# Patient Record
Sex: Female | Born: 1970 | Race: Black or African American | Hispanic: No | Marital: Single | Smoking: Current every day smoker
Health system: Southern US, Community
[De-identification: ages and names within clinical notes are randomized; demographics above are authoritative.]

## PROBLEM LIST (undated history)

## (undated) DIAGNOSIS — I739 Peripheral vascular disease, unspecified: Secondary | ICD-10-CM

## (undated) DIAGNOSIS — G8929 Other chronic pain: Secondary | ICD-10-CM

## (undated) DIAGNOSIS — L301 Dyshidrosis [pompholyx]: Secondary | ICD-10-CM

## (undated) DIAGNOSIS — I700 Atherosclerosis of aorta: Secondary | ICD-10-CM

## (undated) DIAGNOSIS — R200 Anesthesia of skin: Secondary | ICD-10-CM

## (undated) DIAGNOSIS — R590 Localized enlarged lymph nodes: Secondary | ICD-10-CM

## (undated) DIAGNOSIS — I701 Atherosclerosis of renal artery: Secondary | ICD-10-CM

## (undated) DIAGNOSIS — F419 Anxiety disorder, unspecified: Secondary | ICD-10-CM

## (undated) DIAGNOSIS — M549 Dorsalgia, unspecified: Secondary | ICD-10-CM

## (undated) DIAGNOSIS — Z87442 Personal history of urinary calculi: Secondary | ICD-10-CM

## (undated) DIAGNOSIS — I2510 Atherosclerotic heart disease of native coronary artery without angina pectoris: Secondary | ICD-10-CM

## (undated) DIAGNOSIS — I10 Essential (primary) hypertension: Secondary | ICD-10-CM

## (undated) DIAGNOSIS — F17200 Nicotine dependence, unspecified, uncomplicated: Secondary | ICD-10-CM

## (undated) HISTORY — DX: Dyshidrosis (pompholyx): L30.1

## (undated) HISTORY — DX: Nicotine dependence, unspecified, uncomplicated: F17.200

## (undated) HISTORY — DX: Dorsalgia, unspecified: M54.9

## (undated) HISTORY — DX: Peripheral vascular disease, unspecified: I73.9

## (undated) HISTORY — DX: Personal history of urinary calculi: Z87.442

## (undated) HISTORY — DX: Essential (primary) hypertension: I10

## (undated) HISTORY — DX: Atherosclerosis of aorta: I70.0

## (undated) HISTORY — DX: Anesthesia of skin: R20.0

## (undated) HISTORY — DX: Other chronic pain: G89.29

---

## 2001-09-17 ENCOUNTER — Encounter: Admission: RE | Admit: 2001-09-17 | Discharge: 2001-09-17 | Payer: Self-pay

## 2005-12-26 ENCOUNTER — Emergency Department (HOSPITAL_COMMUNITY): Admission: EM | Admit: 2005-12-26 | Discharge: 2005-12-26 | Payer: Self-pay | Admitting: Emergency Medicine

## 2009-06-11 DIAGNOSIS — I219 Acute myocardial infarction, unspecified: Secondary | ICD-10-CM

## 2009-06-11 HISTORY — DX: Acute myocardial infarction, unspecified: I21.9

## 2009-06-11 HISTORY — PX: CORONARY STENT PLACEMENT: SHX1402

## 2009-12-09 ENCOUNTER — Emergency Department (HOSPITAL_COMMUNITY)
Admission: EM | Admit: 2009-12-09 | Discharge: 2009-12-09 | Payer: Self-pay | Source: Home / Self Care | Admitting: Emergency Medicine

## 2010-02-19 ENCOUNTER — Emergency Department (HOSPITAL_COMMUNITY): Admission: EM | Admit: 2010-02-19 | Discharge: 2010-02-19 | Payer: Self-pay | Admitting: Emergency Medicine

## 2010-07-12 HISTORY — PX: OTHER SURGICAL HISTORY: SHX169

## 2010-12-08 ENCOUNTER — Telehealth: Payer: Self-pay | Admitting: Cardiovascular Disease

## 2010-12-08 NOTE — Telephone Encounter (Signed)
Patient was referred to our office for follow up of stent placement.  Stent was placed the [facility].  Dr. Clovis Riley office is waiting for records from [facility] before sending to [place].  They will fax as time gets closer.  I scheduled patient for 12/29/2010.

## 2010-12-11 ENCOUNTER — Encounter: Payer: Self-pay | Admitting: Cardiovascular Disease

## 2010-12-29 ENCOUNTER — Ambulatory Visit (INDEPENDENT_AMBULATORY_CARE_PROVIDER_SITE_OTHER): Payer: Medicaid Other | Admitting: Cardiovascular Disease

## 2010-12-29 ENCOUNTER — Encounter: Payer: Self-pay | Admitting: Cardiovascular Disease

## 2010-12-29 DIAGNOSIS — Z01818 Encounter for other preprocedural examination: Secondary | ICD-10-CM

## 2010-12-29 DIAGNOSIS — E785 Hyperlipidemia, unspecified: Secondary | ICD-10-CM

## 2010-12-29 DIAGNOSIS — I701 Atherosclerosis of renal artery: Secondary | ICD-10-CM | POA: Insufficient documentation

## 2010-12-29 DIAGNOSIS — I10 Essential (primary) hypertension: Secondary | ICD-10-CM | POA: Insufficient documentation

## 2010-12-29 DIAGNOSIS — I2510 Atherosclerotic heart disease of native coronary artery without angina pectoris: Secondary | ICD-10-CM | POA: Insufficient documentation

## 2010-12-29 HISTORY — DX: Atherosclerosis of renal artery: I70.1

## 2010-12-29 HISTORY — DX: Essential (primary) hypertension: I10

## 2010-12-29 HISTORY — DX: Hyperlipidemia, unspecified: E78.5

## 2010-12-29 LAB — CBC WITH DIFFERENTIAL/PLATELET
Basophils Absolute: 0 10*3/uL (ref 0.0–0.1)
Basophils Relative: 0.2 % (ref 0.0–3.0)
HCT: 41.3 % (ref 36.0–46.0)
MCHC: 33.1 g/dL (ref 30.0–36.0)
Monocytes Relative: 4.1 % (ref 3.0–12.0)
Neutro Abs: 8.2 10*3/uL — ABNORMAL HIGH (ref 1.4–7.7)
Neutrophils Relative %: 76.7 % (ref 43.0–77.0)
Platelets: 246 10*3/uL (ref 150.0–400.0)
RBC: 4.1 Mil/uL (ref 3.87–5.11)
RDW: 14.1 % (ref 11.5–14.6)
WBC: 10.7 10*3/uL — ABNORMAL HIGH (ref 4.5–10.5)

## 2010-12-29 LAB — PROTIME-INR: Prothrombin Time: 10.5 s (ref 10.2–12.4)

## 2010-12-29 LAB — BASIC METABOLIC PANEL
Glucose, Bld: 110 mg/dL — ABNORMAL HIGH (ref 70–99)
Potassium: 4.7 mEq/L (ref 3.5–5.1)

## 2010-12-29 NOTE — Assessment & Plan Note (Addendum)
Pt presents with exertional chest pain and dyspnea.  This certainly could be due to coronary stent restenosis.  I have recommended that we do a repeat cath.  We have discussed the risks, benefits, and options about cardiac cath.  She understands and agrees to proceed.  We will schedule her for a left heart cath with possible PCI.  I have asked Dr. Sanjuana Kava to do the cath since I will not be available to do it.  We will also do a distal aortogram if the creatinine is not too elevated.  If the cath is clean, the symptoms are likely due to uncontrolled HTN.   She needs a much more aggressive approach to her HTN.  We may be able to use and ACE-inhibitor but I would like to see what her renal function is.  I strongly advised that she stop smoking today. She has significant atherosclerotic disease and her continued cigarette smoking was a poor risk for further vascular problems.

## 2010-12-29 NOTE — Progress Notes (Signed)
Laura Garrett Date of Birth  Mar 31, 1971 [facility] / [facility] [address]     [address] [place], [place]  [address] [phone]  Fax  [phone]  History of Present Illness:  40 yo with hx of CAD - s/p stent in [place],  Hx of HTN and hyperlipidemia.  Also history of renal artery stent. She does not have any information about her stents. She does have a stent card but she did not bring it with her.   Complains of constant HA.  Still having exertional chest pressure and dyspnea.  Walks daily - has cp when she walks up hill or when she is mowing her yard.  She denies any syncope or presyncope. She denies any PND or orthopnea.  Current Outpatient Prescriptions on File Prior to Visit  Medication Sig Dispense Refill  . amLODipine (NORVASC) 10 MG tablet Take 10 mg by mouth daily.        . clopidogrel (PLAVIX) 75 MG tablet Take 75 mg by mouth daily.        . nitroGLYCERIN (NITROSTAT) 0.4 MG SL tablet Place 0.4 mg under the tongue every 5 (five) minutes as needed.        . simvastatin (ZOCOR) 40 MG tablet Take 40 mg by mouth at bedtime.        Marland Kitchen CITALOPRAM HYDROBROMIDE PO Take 10 mg by mouth daily.          No Known Allergies  Past Medical History  Diagnosis Date  . Hypertension   . MI (myocardial infarction)   . Chronic back pain   . Leg numbness   . Numbness of foot   . Leg weakness     Past Surgical History  Procedure Date  . Coronary stent placement 2011  . Kidney stent placement 07/2010    History  Smoking status  . Current Everyday Smoker -- 0.5 packs/day  . Types: Cigarettes  Smokeless tobacco  . Not on file    History  Alcohol Use No    Family History  Problem Relation Age of Onset  . Hypertension Mother   . Hypertension Brother   . Hypertension Paternal Grandmother   . Hyperlipidemia Paternal Grandmother   . Stroke Paternal Grandmother   . Kidney disease Paternal Grandmother     Reviw of Systems:  Reviewed in  the HPI.  All other systems are negative.  Physical Exam: BP 160/100  Pulse 60  Ht 5\' 6"  (1.676 m)  Wt 135 lb 6.4 oz (61.417 kg)  BMI 21.85 kg/m2 The patient is alert and oriented x 3.  The mood and affect are normal.   Skin: warm and dry.  Color is normal.    HEENT:   the sclera are nonicteric.  The mucous membranes are moist.  The carotids are 2+ without bruits.  There is no thyromegaly.  There is no JVD.    Lungs: clear.  The chest wall is non tender.    Heart: regular rate with a normal S1 and S2.  There are no murmurs, gallops, or rubs. The PMI is not displaced.     Abdomin: good bowel sounds.  There is no guarding or rebound.  There is no hepatosplenomegaly or tenderness.  There are no masses.   Extremities:  no clubbing, cyanosis, or edema.  The legs are without rashes.  The distal pulses are intact.   Neuro:  Cranial nerves II - XII are intact.  Motor and sensory functions are intact.  The gait is normal.  ECG: Sinus bradycardia.  Voltage for LVH.  Assessment / Plan:

## 2011-01-01 ENCOUNTER — Encounter: Payer: Self-pay | Admitting: Cardiovascular Disease

## 2011-01-02 ENCOUNTER — Telehealth: Payer: Self-pay | Admitting: Cardiovascular Disease

## 2011-01-02 ENCOUNTER — Inpatient Hospital Stay (HOSPITAL_BASED_OUTPATIENT_CLINIC_OR_DEPARTMENT_OTHER)
Admission: RE | Admit: 2011-01-02 | Discharge: 2011-01-02 | Disposition: A | Discharge status: Home / Self Care | Payer: Medicaid Other | Source: Ambulatory Visit | Attending: Cardiovascular Disease | Admitting: Cardiovascular Disease

## 2011-01-02 DIAGNOSIS — I2510 Atherosclerotic heart disease of native coronary artery without angina pectoris: Secondary | ICD-10-CM | POA: Insufficient documentation

## 2011-01-02 DIAGNOSIS — R079 Chest pain, unspecified: Secondary | ICD-10-CM

## 2011-01-02 DIAGNOSIS — Z9861 Coronary angioplasty status: Secondary | ICD-10-CM | POA: Insufficient documentation

## 2011-01-02 DIAGNOSIS — I739 Peripheral vascular disease, unspecified: Secondary | ICD-10-CM

## 2011-01-02 NOTE — Telephone Encounter (Signed)
Mark from [facility] cath lab called and wanted to set up follow cath appointment 2-3 wks fm now.

## 2011-01-03 NOTE — Cardiovascular Report (Signed)
NAME:  Laura Garrett NO.:  [id number]  MEDICAL RECORD NO.:  [id number]  LOCATION:                                 FACILITY:  PHYSICIAN:  Verne Carrow, MDDATE OF BIRTH:  1971-04-04  DATE OF PROCEDURE:  01/02/2011 DATE OF DISCHARGE:                           CARDIAC CATHETERIZATION   PRIMARY CARDIOLOGIST:  Vesta Mixer, MD  PROCEDURE PERFORMED: 1. Left heart catheterization. 2. Selective coronary angiography. 3. Left ventricular angiogram. 4. Distal aortogram.  OPERATOR:  Verne Carrow, MD  INDICATIONS:  This is a 40 year old African American female with a history of hypertension and coronary artery disease who was seen recently in our office by Dr. Elease Hashimoto.  The patient had complaints of chest discomfort.  She does have a history of a right coronary artery stent in the past in [place], [place] in the setting of a myocardial infarction.  She also has a history of a renal artery stent. Diagnostic catheterization was planned for today.  PROCEDURE IN DETAIL:  The patient was brought to the outpatient cardiac catheterization laboratory after signing informed consent for the procedure.  The right groin was prepped and draped in sterile fashion. Lidocaine 1% was used for local anesthesia.  A 4-French sheath was inserted into the right femoral artery without difficulty.  Standard diagnostic catheters were used to perform selective coronary angiography.  A pigtail catheter was used to perform a left ventricular angiogram.  The patient tolerated the procedure well.  She was taken to the recovery area in stable condition.  HEMODYNAMIC FINDINGS:  Central aortic pressure 136/78.  Left ventricular pressure 133/13/19.  ANGIOGRAPHIC FINDINGS: 1. The left main coronary artery had no evidence of disease. 2. The left anterior descending was a large artery that coursed to the     apex and gives off two diagonal branches that are both free  of     disease.  There is no obstructive disease noted in the LAD. 3. The circumflex artery gives off moderate-sized bifurcating obtuse     marginal branch that is free of disease.  The AV groove circumflex     is free of disease. 4. The right coronary artery is a large dominant vessel with a stent     present in the distal portion that is patent with no restenosis. 5. Left ventricular angiogram was performed in the RAO projection, it     showed normal left ventricular systolic function with ejection     fraction of 60%. 6. Distal aortogram showed no evidence of disease in the distal aorta.     The bilateral renal arteries are patent without any evidence of     disease.  I am unable to see a stent in the renal arteries.  This     was reported in the history, however, is not visible on the     angiogram.  IMPRESSION: 1. Single-vessel coronary artery disease with patent stent in the     distal right coronary artery. 2. Bilateral renal arteries are patent. 3. Normal left ventricular systolic function.  RECOMMENDATIONS:  I recommend continued medical management at this time.     Verne Carrow, MD     CM/MEDQ  D:  01/02/2011  T:  01/02/2011  Job:  [id number]  cc:   Vesta Mixer, M.D.  Electronically Signed by Verne Carrow MD on 01/03/2011 01:47:57 PM

## 2011-01-05 ENCOUNTER — Ambulatory Visit (INDEPENDENT_AMBULATORY_CARE_PROVIDER_SITE_OTHER): Payer: Medicaid Other | Admitting: *Deleted

## 2011-01-05 ENCOUNTER — Other Ambulatory Visit: Payer: Self-pay | Admitting: Cardiovascular Disease

## 2011-01-05 ENCOUNTER — Ambulatory Visit: Payer: Medicaid Other | Admitting: Nurse Practitioner

## 2011-01-05 ENCOUNTER — Telehealth: Payer: Self-pay | Admitting: *Deleted

## 2011-01-05 ENCOUNTER — Other Ambulatory Visit: Payer: Self-pay | Admitting: Cardiology

## 2011-01-05 DIAGNOSIS — M79609 Pain in unspecified limb: Secondary | ICD-10-CM

## 2011-01-05 DIAGNOSIS — R52 Pain, unspecified: Secondary | ICD-10-CM

## 2011-01-05 DIAGNOSIS — Z9889 Other specified postprocedural states: Secondary | ICD-10-CM

## 2011-01-05 MED ORDER — TRAMADOL HCL 50 MG PO TABS: 50.0000 mg | ORAL_TABLET | Freq: Four times a day (QID) | ORAL | Status: AC | PRN

## 2011-01-05 NOTE — Telephone Encounter (Signed)
Walk in c/o right groin pain post LHC, 01/02/11 dr Hassell Halim, pt of dr Elease Hashimoto. On assessment both groins compared, Right groin extremely tender, hard edema right medial thigh approx  2cm by 7cm and c/o right back pain. Dr Elease Hashimoto contacted by phone and Norma Fredrickson NP consulted, doppler ordered with stat call back ordered. Pt ambulated back to family member and went to [facility] for test. Alfonso Ramus RN

## 2011-01-05 NOTE — Telephone Encounter (Signed)
No pseudo, av fistula or dvt but has enlarged glands throughout groin, dr cooper evaluated and said this isn't unusual post cath. Dr [place] gave rx for tramadol that was called in, dr aware pt on lexipro and citalapram. Pt to rest leg, may alternate warm compresses or use ice if feels better with either.

## 2011-01-08 ENCOUNTER — Encounter: Payer: Self-pay | Admitting: Cardiovascular Disease

## 2011-01-10 ENCOUNTER — Encounter: Payer: Self-pay | Admitting: *Deleted

## 2011-01-11 ENCOUNTER — Telehealth: Payer: Self-pay | Admitting: *Deleted

## 2011-01-11 NOTE — Telephone Encounter (Signed)
msg left to see how she is doing post lhc and problems she was having with right groin. Asked her to call office back with an update.Alfonso Ramus RN

## 2011-01-22 ENCOUNTER — Ambulatory Visit: Payer: Medicaid Other | Admitting: Cardiovascular Disease

## 2012-05-21 ENCOUNTER — Encounter (HOSPITAL_COMMUNITY): Payer: Self-pay | Admitting: Neurology

## 2012-05-21 ENCOUNTER — Emergency Department (HOSPITAL_COMMUNITY)
Admission: EM | Admit: 2012-05-21 | Discharge: 2012-05-21 | Disposition: A | Discharge status: Home / Self Care | Payer: Medicaid Other | Attending: Emergency Medicine | Admitting: Emergency Medicine

## 2012-05-21 ENCOUNTER — Emergency Department (HOSPITAL_COMMUNITY): Payer: Medicaid Other

## 2012-05-21 DIAGNOSIS — G8929 Other chronic pain: Secondary | ICD-10-CM | POA: Insufficient documentation

## 2012-05-21 DIAGNOSIS — I10 Essential (primary) hypertension: Secondary | ICD-10-CM | POA: Insufficient documentation

## 2012-05-21 DIAGNOSIS — I2510 Atherosclerotic heart disease of native coronary artery without angina pectoris: Secondary | ICD-10-CM | POA: Insufficient documentation

## 2012-05-21 DIAGNOSIS — Z8679 Personal history of other diseases of the circulatory system: Secondary | ICD-10-CM | POA: Insufficient documentation

## 2012-05-21 DIAGNOSIS — F411 Generalized anxiety disorder: Secondary | ICD-10-CM | POA: Insufficient documentation

## 2012-05-21 DIAGNOSIS — R0789 Other chest pain: Secondary | ICD-10-CM | POA: Insufficient documentation

## 2012-05-21 DIAGNOSIS — R079 Chest pain, unspecified: Secondary | ICD-10-CM

## 2012-05-21 DIAGNOSIS — I252 Old myocardial infarction: Secondary | ICD-10-CM | POA: Insufficient documentation

## 2012-05-21 DIAGNOSIS — M549 Dorsalgia, unspecified: Secondary | ICD-10-CM | POA: Insufficient documentation

## 2012-05-21 DIAGNOSIS — Z79899 Other long term (current) drug therapy: Secondary | ICD-10-CM | POA: Insufficient documentation

## 2012-05-21 DIAGNOSIS — F17200 Nicotine dependence, unspecified, uncomplicated: Secondary | ICD-10-CM | POA: Insufficient documentation

## 2012-05-21 DIAGNOSIS — Z9861 Coronary angioplasty status: Secondary | ICD-10-CM | POA: Insufficient documentation

## 2012-05-21 HISTORY — DX: Localized enlarged lymph nodes: R59.0

## 2012-05-21 HISTORY — DX: Atherosclerotic heart disease of native coronary artery without angina pectoris: I25.10

## 2012-05-21 HISTORY — DX: Anxiety disorder, unspecified: F41.9

## 2012-05-21 HISTORY — DX: Atherosclerosis of renal artery: I70.1

## 2012-05-21 LAB — BASIC METABOLIC PANEL
BUN: 12 mg/dL (ref 6–23)
CO2: 22 mEq/L (ref 19–32)
Calcium: 9.1 mg/dL (ref 8.4–10.5)
GFR calc non Af Amer: 77 mL/min — ABNORMAL LOW (ref >90)
Glucose, Bld: 100 mg/dL — ABNORMAL HIGH (ref 70–99)
Potassium: 4 mEq/L (ref 3.5–5.1)

## 2012-05-21 LAB — CBC
HCT: 37.2 % (ref 36.0–46.0)
MCV: 93.5 fL (ref 78.0–100.0)
Platelets: 232 10*3/uL (ref 150–400)
RDW: 13.6 % (ref 11.5–15.5)

## 2012-05-21 LAB — PROTIME-INR
INR: 0.99 (ref 0.00–1.49)
Prothrombin Time: 13 seconds (ref 11.6–15.2)

## 2012-05-21 MED ORDER — NITROGLYCERIN 0.4 MG SL SUBL: 0.4000 mg | SUBLINGUAL_TABLET | SUBLINGUAL | Status: DC | PRN

## 2012-05-21 MED ORDER — MORPHINE SULFATE 4 MG/ML IJ SOLN
4.0000 mg | Freq: Once | INTRAMUSCULAR | Status: AC
  Administered 2012-05-21: 4 mg via INTRAVENOUS
  Filled 2012-05-21: qty 1

## 2012-05-21 MED ORDER — ASPIRIN 81 MG PO CHEW: 324.0000 mg | CHEWABLE_TABLET | Freq: Once | ORAL | Status: DC

## 2012-05-21 NOTE — ED Notes (Signed)
Patient transported to X-ray 

## 2012-05-21 NOTE — Progress Notes (Addendum)
   Instructions Provided To Patient For Stress Test/Followup:   Start taking baby aspirin (81mg ) by mouth daily. Continue your other home medications.   You have a Stress Test scheduled at [facility]. Your doctor has ordered this test to get a better idea of how your heart works.  STRESS TEST APPOINTMENT: 05/28/12 at 8:30am FOLLOW UP APPOINTMENT WITH Norma Fredrickson, NP: 05/30/12 1:30pm  Please arrive 15 minutes early for paperwork.   Location: [address] [place], [place] [address] [phone]  Instructions:  No food/drink after midnight the night before.   No caffeine/decaf products 24 hours before, including medicines such as Excedrin or Goody Powders. Call if there are any questions.   Wear comfortable clothes and shoes.   It is OK to take your morning meds with a sip of water EXCEPT for those types of medicines listed below or otherwise instructed.  Special Medication Instructions:  **PLEASE REVIEW!!!!!**  Beta blockers such as metoprolol (Lopressor/Toprol XL), atenolol (Tenormin), carvedilol (Coreg), nebivolol (Bystolic), propranolol (Inderal) should not be taken for 24 hours before the test.  Calcium channel blockers such as diltiazem (Cardizem) or verapmil (Calan) should not be taken for 24 hours before the test.  Remove nitroglycerin patches and do not take nitrate preparations such as Imdur/isosorbide the day of your test.  No Persantine/Theophylline or Aggrenox medicines should be used within 24 hours of the test.   What To Expect: The whole test will take several hours. When you arrive in the lab, the technician will inject a small amount of radioactive tracer into your arm through an IV while you are resting quietly. This helps [place] to form pictures of your heart. You will likely only feel a sting from the IV. After a waiting period, resting pictures will be obtained under a big camera. These are the "before" pictures.  Next, you will be prepped  for the stress portion of the test. This may include either walking on a treadmill or receiving a medicine that helps to dilate blood vessels in your heart to simulate the effect of exercise on your heart. If you are walking on a treadmill, you will walk at different paces to try to get your heart rate to a goal number that is based on your age. If your doctor has chosen the pharmacologic test, then you will receive a medicine through your IV that may cause temporary nausea, flushing, shortness of breath and sometimes chest discomfort or vomiting. This is typically short-lived and usually resolves quickly. Your blood pressure and heart rate will be monitored, and we will be watching your EKG on a computer screen for any changes. During this portion of the test, the radiologist will inject another small amount of radioactive tracer into your IV. After a waiting period, you will undergo a second set of pictures. These are the "after" pictures. The doctor reading the test will compare the before-and-after images to look for evidence of heart blockages or heart weakness.  The doctor reading the test will compare the before-and-after images to look for evidence of heart blockages or heart weakness. In certain instances, this test is done over 2 days but usually only takes 1 day to complete.

## 2012-05-21 NOTE — Consult Note (Signed)
Cardiology Consult  Patient ID: Diego Delancey MRN: [id number], DOB: April 23, 1971 Date of Encounter: 05/21/2012, 2:33 PM Primary Physician: Gabriel Cirri DO Primary Cardiologist: Nahser  Chief Complaint: CP  HPI: Ms. Tresa Res is a 41 y/o F with history of CAD s/p MI/RCA stent 2011, reported renal artery stenting, HTN, tobacco & marijuana abuse/mod EtOH who presented to [facility] with complaints of chest pain. She woke up in her usual state of health this morning. While sweeping the floor she began to develop substernal chest stabbing and pressure associated with SOB. She broke out into a sweat and was trembling. The pain was worse when lying down and worse with inspiration & palpation. No dizziness or syncope. Because the SOB component reminded her somewhat of her prior angina, she did not hesitate to come to the ER. She was given SL NTG without relief (gave her headache). 4mg  of morphine eliminated the pain, including the discomfort with inspiration. Sweeping is the most strenuous activity she does, and has not had any recent CP/SOB with this. She denies any LEE, orthopnea, PND, weight change, relationship of pain to meals, recent bedrest/travel, nausea or vomiting. She did fracture her wrist 2 months ago from a fall, but no limb swelling/redness. In the ER, BP mildly elevated (144/93). CXR no acute dz. BMET, CBC unremarkable and troponin neg x 1. Not tachypneic, tachycardic, or hypoxic. She is without CP or SOB now.  Past Medical History  Diagnosis Date  . Hypertension   . Chronic back pain     Associated with occasional leg numbness  . Leg numbness   . Anxiety   . CAD (coronary artery disease)     a. RCA stent/MI 04/2010 in [place].  . Renal artery stenosis of unknown cause     Reported bilateral renal artery stents in 2012, not seen on angiogram 12/2010  . Lymphadenopathy, inguinal     RLE lymphadenopathy on PV dopplers 12/2010     Most Recent Cardiac Studies: Cardiac Cath  12/2010: INDICATIONS: This is a 41 year old African American female with a history of hypertension and coronary artery disease who was seen recently in our office by Dr. Elease Hashimoto. The patient had complaints of chest discomfort. She does have a history of a right coronary artery stent in the past in [place], [place] in the setting of a myocardial infarction. She also has a history of a renal artery stent.  Diagnostic catheterization was planned for today.  PROCEDURE IN DETAIL: The patient was brought to the outpatient cardiac catheterization laboratory after signing informed consent for the procedure. The right groin was prepped and draped in sterile fashion. Lidocaine 1% was used for local anesthesia. A 4-French sheath was inserted into the right femoral artery without difficulty. Standard diagnostic catheters were used to perform selective coronary angiography. A pigtail catheter was used to perform a left ventricular angiogram. The patient tolerated the procedure well. She was taken to the recovery area in stable condition.  HEMODYNAMIC FINDINGS: Central aortic pressure 136/78. Left ventricular pressure 133/13/19.  ANGIOGRAPHIC FINDINGS:  1. The left main coronary artery had no evidence of disease.  2. The left anterior descending was a large artery that coursed to the apex and gives off two diagonal branches that are both free of disease. There is no obstructive disease noted in the LAD.  3. The circumflex artery gives off moderate-sized bifurcating obtuse marginal branch that is free of disease. The AV groove circumflex is free of disease.  4. The right coronary artery  is a large dominant vessel with a stent present in the distal portion that is patent with no restenosis.  5. Left ventricular angiogram was performed in the RAO projection, it showed normal left ventricular systolic function with ejection fraction of 60%.  6. Distal aortogram showed no evidence of disease in the distal aorta.  The bilateral renal arteries are patent without any evidence of disease. I am unable to see a stent in the renal arteries. This was reported in the history, however, is not visible on the angiogram.  IMPRESSION:  1. Single-vessel coronary artery disease with patent stent in the distal right coronary artery.  2. Bilateral renal arteries are patent.  3. Normal left ventricular systolic function.  RECOMMENDATIONS: I recommend continued medical management at this time.    Surgical History:  Past Surgical History  Procedure Date  . Coronary stent placement 2011  . Kidney stent placement 07/2010     Home Meds: Prior to Admission medications   Medication Sig Start Date End Date Taking? Authorizing Provider  amLODipine (NORVASC) 10 MG tablet Take 10 mg by mouth daily.     Yes Historical Provider, MD  citalopram (CELEXA) 10 MG tablet Take 10 mg by mouth daily.   Yes Historical Provider, MD  clopidogrel (PLAVIX) 75 MG tablet Take 75 mg by mouth daily.   Yes Historical Provider, MD  metoprolol (TOPROL-XL) 50 MG 24 hr tablet Take 50 mg by mouth daily.     Yes Historical Provider, MD  nitroGLYCERIN (NITROSTAT) 0.4 MG SL tablet Place 0.4 mg under the tongue every 5 (five) minutes as needed. For chest pain   Yes Historical Provider, MD  risperiDONE (RISPERDAL) 1 MG tablet Take 1 mg by mouth daily as needed. Supposed to take three times a day but only takes once daily as needed   Yes Historical Provider, MD  simvastatin (ZOCOR) 40 MG tablet Take 40 mg by mouth at bedtime.     Yes Historical Provider, MD  She does not take any aspirin at home. She states she thought she was told not to take it by her PCP because she's already on another blood thinner, Plavix. I called [facility] in [place] to clarify since there was question in ER notes of being on Coumadin. She denies being on Coumadin. The [facility] has her listed as taking Plav[facility], last picked up 04/21/2012 - no other blood thinning medications on file.  Allergies: No  Known Allergies  History   Social History  . Marital Status: Single    Spouse Name: N/A    Number of Children: N/A  . Years of Education: N/A   Occupational History  . Not on file.   Social History Main Topics  . Smoking status: Current Every Day Smoker -- 1.0 packs/day for 15 years    Types: Cigarettes  . Smokeless tobacco: Not on file  . Alcohol Use: Yes     Comment: 4-5 beers once-twice week  . Drug Use: Yes     Comment: Daily marijuana  . Sexually Active: Not on file   Other Topics Concern  . Not on file   Social History Narrative  . No narrative on file     Family History  Problem Relation Age of Onset  . Hypertension Mother   . Hypertension Brother   . Hypertension Paternal Grandmother   . Hyperlipidemia Paternal Grandmother   . Stroke Paternal Grandmother   . Kidney disease Paternal Grandmother   . CAD Paternal Grandmother     stent  and pacemaker    Review of Systems: General: negative for chills, fever, night sweats or weight changes. She has noticed persistent R inguinal lymphadenopathy, nontender Cardiovascular: see above. Whenever she gets anxious (1x/month) she feels her HR increase. Dermatological: negative for rash Respiratory: negative for cough or wheezing Urologic: negative for hematuria Abdominal: negative for nausea, vomiting, diarrhea, bright red blood per rectum, melena, or hematemesis Neurologic: negative for visual changes, syncope, or dizziness All other systems reviewed and are otherwise negative except as noted above.  Labs:   Lab Results  Component Value Date   WBC 8.4 05/21/2012   HGB 12.6 05/21/2012   HCT 37.2 05/21/2012   MCV 93.5 05/21/2012   PLT 232 05/21/2012     Lab 05/21/12 1159  NA 140  K 4.0  CL 106  CO2 22  BUN 12  CREATININE 0.92  CALCIUM 9.1  PROT --  BILITOT --  ALKPHOS --  ALT --  AST --  GLUCOSE 100*    Basename 05/21/12 1159  CKTOTAL --  CKMB --  TROPONINI <0.30    Radiology/Studies:  Dg  Chest 2 View12/04/2012  *RADIOLOGY REPORT*  Clinical Data: Chest pain  CHEST - 2 VIEW  Comparison: 01/04/2012  Findings: The heart size and mediastinal contours are within normal limits.  Both lungs are clear.  The visualized skeletal structures are unremarkable.  IMPRESSION: No active cardiopulmonary abnormalities.   Original Report Authenticated By: Signa Kell, M.D.     EKG: NSR 68bpm, possible LVH, nonspecific ST-T changes Appears unchanged from 12/2010  Physical Exam: Blood pressure 149/96, pulse 66, temperature 98.5 F (36.9 C), temperature source Oral, resp. rate 16, last menstrual period 05/07/2012, SpO2 100.00%. General: Well developed, well nourished AAF in no acute distress. Room smells heavily of tobacco smoke. Head: Normocephalic, atraumatic, sclera non-icteric, no xanthomas, nares are without discharge.  Neck: Negative for carotid bruits. JVD not elevated. No abnormal cervical or supraclavicular lymphadenopathy. Lungs: Clear bilaterally to auscultation without wheezes, rales, or rhonchi. Breathing is unlabored. Heart: RRR with S1 S2, physiologically split S2. No murmurs, rubs, or gallops appreciated. Her sharp/pressure chest discomfort is reproducible with marked tenderness by sternal palpation. Abdomen: Soft, non-tender, non-distended with normoactive bowel sounds. No hepatomegaly. No rebound/guarding. No obvious abdominal masses. Msk:  Strength and tone appear normal for age. Extremities: No clubbing or cyanosis. No edema.  Distal pedal pulses are 2+ and equal bilaterally. R inguinal area has small 1cm lymph node, nontender. Not appreciated on R side. No epitrochlear notes. No abnormal bruising. Neuro: Alert and oriented X 3. No focal deficit. No facial asymmetry. Moves all extremities spontaneously. Psych:  Responds to questions appropriately with a normal affect.    ASSESSMENT AND PLAN:   1. Chest pain, mostly atypical features, suspect musculoskeletal 2. Known CAD s/p  MI/RCA stent 2011 3. Polysubstance abuse with tobacco & THC, counseled on cessation 4. Moderate EtOH use, instructed to decrease use 5. HTN 6. R inguinal lymphadenopathy  Patient seen and examined by myself and Dr. Gala Romney. Chest pain has mostly atypical features, suspect musculoskeletal. No objective evidence of ischemia thus far. PE unlikely given lack of clinical findings. Give 324mg  ASA today and would recommend to continue low dose aspirin daily (81mg ). Continue other home meds. Will check 1 more troponin. If negative, will plan for outpatient stress Myoview in our office with subsequent follow-up. If troponin positive, will admit and plan likely cath.  Tentative follow-up at [facility]: Stress Myoview: 05/28/12 at 8:30am  -  See next note  for d/c instructions provided to patient. F/u Norma Fredrickson NP 05/30/12 at 1:30pm Return to ER precautions given.  Signed, Ronie Spies PA-C 05/21/2012, 2:33 PM  Patient seen and examined with Ronie Spies, PA-C. We discussed all aspects of the encounter. I agree with the assessment and plan as stated above.   Chest pain seems much more musculoskeletal. Very reproducible on exam. ECG and CE negative. Given her history we discussed possibility of 24 hour observation but she is adamant she wants to go home. I feel this is acceptable. She will return to ER if things change. Will plan outpatient ETT/Myoview to further risk stratify.    Truman Hayward 4:27 PM

## 2012-05-21 NOTE — ED Notes (Signed)
New and old EKG given to Dr. Lorenso Courier, copy placed in pt chart.

## 2012-05-21 NOTE — ED Notes (Signed)
Per ems- pt reports was sweeping today became sob, sat down had central cp "sharp", hurts to move and breathe. This is abnormal for patient. Cardiac hx with stents. 3 nitro, no 4/10, initial 7/10. Anxious at this time. No aspirin given due to pt taking coumadin. EKG SR. BP 144/86, HR 70. Skin warm and dry. Pt is alert and oriented.

## 2012-05-21 NOTE — ED Notes (Signed)
Spoke with doctor from [place] about pt discharge and follow up for stress test, pt able to repeat this information back to me.

## 2012-05-21 NOTE — ED Provider Notes (Signed)
History     CSN: [id number]  Arrival date & time 05/21/12  1141   First MD Initiated Contact with Patient 05/21/12 1150      Chief Complaint  Patient presents with  . Chest Pain    (Consider location/radiation/quality/duration/timing/severity/associated sxs/prior treatment) HPI Comments: Ms. Laura Garrett presents for evaluation of chest pain.  She states the pain began while she was leisurely sweeping the floor.  Patient is a 41 y.o. female presenting with chest pain. The history is provided by the patient. No language interpreter was used.  Chest Pain The chest pain began 1 - 2 hours ago. Chest pain occurs constantly. The chest pain is improving. The pain is associated with exertion. At its most intense, the pain is at 7/10. The pain is currently at 4/10. The severity of the pain is moderate. The quality of the pain is described as dull and pressure-like. The pain does not radiate. Chest pain is worsened by exertion. Pertinent negatives for primary symptoms include no fever, no fatigue, no syncope, no shortness of breath, no cough, no wheezing, no palpitations, no abdominal pain, no nausea, no vomiting, no dizziness and no altered mental status.  Pertinent negatives for associated symptoms include no claudication, no diaphoresis, no lower extremity edema, no near-syncope, no numbness, no orthopnea, no paroxysmal nocturnal dyspnea and no weakness. She tried nothing for the symptoms.  Her past medical history is significant for CAD and hypertension.  Procedure history is positive for cardiac catheterization and echocardiogram.     Past Medical History  Diagnosis Date  . Hypertension   . MI (myocardial infarction)   . Chronic back pain   . Leg numbness   . Numbness of foot   . Leg weakness   . Anxiety     Past Surgical History  Procedure Date  . Coronary stent placement 2011  . Kidney stent placement 07/2010    Family History  Problem Relation Age of Onset  . Hypertension Mother    . Hypertension Brother   . Hypertension Paternal Grandmother   . Hyperlipidemia Paternal Grandmother   . Stroke Paternal Grandmother   . Kidney disease Paternal Grandmother     History  Substance Use Topics  . Smoking status: Current Every Day Smoker -- 0.5 packs/day    Types: Cigarettes  . Smokeless tobacco: Not on file  . Alcohol Use: Yes    OB History    Grav Para Term Preterm Abortions TAB SAB Ect Mult Living                  Review of Systems  Constitutional: Negative for fever, chills, diaphoresis, activity change, appetite change and fatigue.  HENT: Negative for congestion, facial swelling, rhinorrhea, sneezing, neck pain and voice change.   Eyes: Negative.   Respiratory: Negative for cough, chest tightness, shortness of breath and wheezing.   Cardiovascular: Positive for chest pain. Negative for palpitations, orthopnea, claudication, leg swelling, syncope and near-syncope.  Gastrointestinal: Negative for nausea, vomiting, abdominal pain, constipation and abdominal distention.  Genitourinary: Negative.   Musculoskeletal: Negative for back pain, joint swelling, arthralgias and gait problem.  Skin: Negative for rash and wound.  Neurological: Negative.  Negative for dizziness, weakness and numbness.  Hematological: Negative.   Psychiatric/Behavioral: Negative.  Negative for altered mental status.    Allergies  Review of patient's allergies indicates no known allergies.  Home Medications   Current Outpatient Rx  Name  Route  Sig  Dispense  Refill  . AMLODIPINE BESYLATE 10 MG PO  TABS   Oral   Take 10 mg by mouth daily.           Marland Kitchen CITALOPRAM HYDROBROMIDE 10 MG PO TABS   Oral   Take 10 mg by mouth daily.         Marland Kitchen METOPROLOL SUCCINATE ER 50 MG PO TB24   Oral   Take 50 mg by mouth daily.           Marland Kitchen NITROGLYCERIN 0.4 MG SL SUBL   Sublingual   Place 0.4 mg under the tongue every 5 (five) minutes as needed. For chest pain         . SIMVASTATIN 40 MG  PO TABS   Oral   Take 40 mg by mouth at bedtime.             BP 144/93  Pulse 65  Temp 98.5 F (36.9 C) (Oral)  Resp 15  SpO2 100%  LMP 05/07/2012  Physical Exam  Nursing note and vitals reviewed. Constitutional: She is oriented to person, place, and time. She appears well-developed and well-nourished. No distress.  HENT:  Head: Normocephalic and atraumatic.  Right Ear: External ear normal.  Left Ear: External ear normal.  Nose: Nose normal.  Mouth/Throat: Oropharynx is clear and moist. No oropharyngeal exudate.  Eyes: Conjunctivae normal and EOM are normal. Pupils are equal, round, and reactive to light. Right eye exhibits no discharge. Left eye exhibits no discharge. No scleral icterus.  Neck: Normal range of motion. Neck supple. No JVD present. No tracheal deviation present.  Cardiovascular: Normal rate, regular rhythm, normal heart sounds and intact distal pulses.  Exam reveals no gallop and no friction rub.   No murmur heard. Pulmonary/Chest: Effort normal and breath sounds normal. No stridor. No respiratory distress. She has no wheezes. She has no rales. She exhibits no tenderness.  Abdominal: Soft. Bowel sounds are normal. She exhibits no distension and no mass. There is no tenderness. There is no rebound and no guarding.  Musculoskeletal: Normal range of motion. She exhibits no edema and no tenderness.  Lymphadenopathy:    She has no cervical adenopathy.  Neurological: She is alert and oriented to person, place, and time.  Skin: Skin is warm and dry. No rash noted. She is not diaphoretic. No erythema. No pallor.  Psychiatric: She has a normal mood and affect. Her behavior is normal.    ED Course  Procedures (including critical care time)   Labs Reviewed  PROTIME-INR  CBC  BASIC METABOLIC PANEL  TROPONIN I   No results found.   No diagnosis found.   Date: 05/21/2012  Rate: 68 bpm  Rhythm: sinus  QRS Axis: normal  Intervals: normal  ST/T Wave  abnormalities: normal  Conduction Disutrbances:LVH by voltage criteria  Narrative Interpretation:   Old EKG Reviewed: unchanged     MDM  Pt with a known hx of CAD presents for evaluation of chest pain.  She appears nontoxic, note stable VS (mildly elevated BP), NAD.  Initial EKG demonstrates no evidence of acute ischemia.  Will initiate a chest pain evaluation.  She is currently taking coumadin secondary to "narrowed vessels".  Will administer NTG and prn morphine.  Will hold on giving aspirin because she has been advised not to take aspirin.  1325.  Pt stable, NAD.  Pain improved.  Trop negative x 1.  I discussed her evaluation with the on-call provider with the [facility] group.  They will perform a bedside evaluation.  1515.  Pt stable,  NAD.  She was evaluated by Dr. Adriana Reams.  Plan repeat trop, If negative, she will be discharged to follow-up for an outpt stress test.   Tobin Chad, MD 05/21/12 1517

## 2012-05-22 NOTE — Progress Notes (Signed)
Late entry addendum: with regard to R inguinal lymphadenopathy, Ms. Laura Garrett and I discussed the importance of following up with PCP to have this evaluated. CBC WNL but may need further testing given persistence since 2012. She verbalized understanding and states that she regularly sees Dr. Clovis Riley and she will make an appointment. Dayna Dunn PA-C

## 2012-05-28 ENCOUNTER — Encounter (HOSPITAL_COMMUNITY): Payer: Medicaid Other

## 2012-05-30 ENCOUNTER — Ambulatory Visit: Payer: Medicaid Other | Admitting: Nurse Practitioner

## 2012-06-25 ENCOUNTER — Encounter: Payer: Self-pay | Admitting: Nurse Practitioner

## 2013-09-18 ENCOUNTER — Emergency Department: Payer: Self-pay | Admitting: Emergency Medicine

## 2013-09-18 LAB — COMPREHENSIVE METABOLIC PANEL
ALK PHOS: 122 U/L — AB
ALT: 19 U/L (ref 12–78)
Albumin: 3.8 g/dL (ref 3.4–5.0)
Anion Gap: 4 — ABNORMAL LOW (ref 7–16)
BILIRUBIN TOTAL: 0.7 mg/dL (ref 0.2–1.0)
BUN: 9 mg/dL (ref 7–18)
CALCIUM: 9.2 mg/dL (ref 8.5–10.1)
CREATININE: 0.95 mg/dL (ref 0.60–1.30)
Chloride: 109 mmol/L — ABNORMAL HIGH (ref 98–107)
Co2: 25 mmol/L (ref 21–32)
EGFR (Non-African Amer.): >60
Glucose: 69 mg/dL (ref 65–99)
Osmolality: 273 (ref 275–301)
POTASSIUM: 4 mmol/L (ref 3.5–5.1)
SGOT(AST): 33 U/L (ref 15–37)
SODIUM: 138 mmol/L (ref 136–145)
Total Protein: 8.1 g/dL (ref 6.4–8.2)

## 2013-09-18 LAB — CBC
HCT: 42.7 % (ref 35.0–47.0)
HGB: 14.2 g/dL (ref 12.0–16.0)
MCH: 31.9 pg (ref 26.0–34.0)
MCHC: 33.2 g/dL (ref 32.0–36.0)
MCV: 96 fL (ref 80–100)
PLATELETS: 219 10*3/uL (ref 150–440)
RBC: 4.44 10*6/uL (ref 3.80–5.20)
RDW: 14.5 % (ref 11.5–14.5)
WBC: 12.5 10*3/uL — ABNORMAL HIGH (ref 3.6–11.0)

## 2013-09-18 LAB — CK TOTAL AND CKMB (NOT AT ARMC): CK, TOTAL: 176 U/L

## 2013-09-18 LAB — TROPONIN I: Troponin-I: <0.02 ng/mL

## 2013-09-21 ENCOUNTER — Encounter (HOSPITAL_COMMUNITY): Payer: Self-pay | Admitting: Emergency Medicine

## 2013-09-21 ENCOUNTER — Emergency Department (HOSPITAL_COMMUNITY): Payer: Medicaid Other

## 2013-09-21 ENCOUNTER — Emergency Department (HOSPITAL_COMMUNITY)
Admission: EM | Admit: 2013-09-21 | Discharge: 2013-09-21 | Disposition: A | Discharge status: Home / Self Care | Payer: Medicaid Other | Attending: Emergency Medicine | Admitting: Emergency Medicine

## 2013-09-21 DIAGNOSIS — G8929 Other chronic pain: Secondary | ICD-10-CM | POA: Insufficient documentation

## 2013-09-21 DIAGNOSIS — R300 Dysuria: Secondary | ICD-10-CM | POA: Insufficient documentation

## 2013-09-21 DIAGNOSIS — R109 Unspecified abdominal pain: Secondary | ICD-10-CM | POA: Insufficient documentation

## 2013-09-21 DIAGNOSIS — I701 Atherosclerosis of renal artery: Secondary | ICD-10-CM | POA: Insufficient documentation

## 2013-09-21 DIAGNOSIS — N7011 Chronic salpingitis: Secondary | ICD-10-CM

## 2013-09-21 DIAGNOSIS — R599 Enlarged lymph nodes, unspecified: Secondary | ICD-10-CM | POA: Insufficient documentation

## 2013-09-21 DIAGNOSIS — M549 Dorsalgia, unspecified: Secondary | ICD-10-CM | POA: Insufficient documentation

## 2013-09-21 DIAGNOSIS — R209 Unspecified disturbances of skin sensation: Secondary | ICD-10-CM | POA: Insufficient documentation

## 2013-09-21 DIAGNOSIS — I2510 Atherosclerotic heart disease of native coronary artery without angina pectoris: Secondary | ICD-10-CM | POA: Insufficient documentation

## 2013-09-21 DIAGNOSIS — I10 Essential (primary) hypertension: Secondary | ICD-10-CM | POA: Insufficient documentation

## 2013-09-21 DIAGNOSIS — F411 Generalized anxiety disorder: Secondary | ICD-10-CM | POA: Insufficient documentation

## 2013-09-21 DIAGNOSIS — F17200 Nicotine dependence, unspecified, uncomplicated: Secondary | ICD-10-CM | POA: Insufficient documentation

## 2013-09-21 DIAGNOSIS — Z79899 Other long term (current) drug therapy: Secondary | ICD-10-CM | POA: Insufficient documentation

## 2013-09-21 DIAGNOSIS — Z7902 Long term (current) use of antithrombotics/antiplatelets: Secondary | ICD-10-CM | POA: Insufficient documentation

## 2013-09-21 DIAGNOSIS — N7013 Chronic salpingitis and oophoritis: Secondary | ICD-10-CM | POA: Insufficient documentation

## 2013-09-21 LAB — COMPREHENSIVE METABOLIC PANEL
ALK PHOS: 97 U/L (ref 39–117)
ALT: 10 U/L (ref 0–35)
AST: 17 U/L (ref 0–37)
Albumin: 3.7 g/dL (ref 3.5–5.2)
BILIRUBIN TOTAL: 0.4 mg/dL (ref 0.3–1.2)
BUN: 8 mg/dL (ref 6–23)
CHLORIDE: 103 meq/L (ref 96–112)
CO2: 24 meq/L (ref 19–32)
Calcium: 9.4 mg/dL (ref 8.4–10.5)
Creatinine, Ser: 0.86 mg/dL (ref 0.50–1.10)
GFR calc non Af Amer: 83 mL/min — ABNORMAL LOW (ref >90)
GLUCOSE: 118 mg/dL — AB (ref 70–99)
POTASSIUM: 3.7 meq/L (ref 3.7–5.3)
SODIUM: 141 meq/L (ref 137–147)
Total Protein: 7.6 g/dL (ref 6.0–8.3)

## 2013-09-21 LAB — CBC WITH DIFFERENTIAL/PLATELET
Basophils Absolute: 0 10*3/uL (ref 0.0–0.1)
Basophils Relative: 0 % (ref 0–1)
Eosinophils Absolute: 0.2 10*3/uL (ref 0.0–0.7)
Eosinophils Relative: 2 % (ref 0–5)
HCT: 41.3 % (ref 36.0–46.0)
HEMOGLOBIN: 14.4 g/dL (ref 12.0–15.0)
LYMPHS ABS: 1.5 10*3/uL (ref 0.7–4.0)
LYMPHS PCT: 13 % (ref 12–46)
MCH: 33.3 pg (ref 26.0–34.0)
MCHC: 34.9 g/dL (ref 30.0–36.0)
MCV: 95.6 fL (ref 78.0–100.0)
MONOS PCT: 4 % (ref 3–12)
Monocytes Absolute: 0.5 10*3/uL (ref 0.1–1.0)
NEUTROS ABS: 9.3 10*3/uL — AB (ref 1.7–7.7)
NEUTROS PCT: 81 % — AB (ref 43–77)
PLATELETS: 240 10*3/uL (ref 150–400)
RBC: 4.32 MIL/uL (ref 3.87–5.11)
RDW: 13.8 % (ref 11.5–15.5)
WBC: 11.6 10*3/uL — AB (ref 4.0–10.5)

## 2013-09-21 LAB — URINALYSIS, ROUTINE W REFLEX MICROSCOPIC
BILIRUBIN URINE: NEGATIVE
Glucose, UA: NEGATIVE mg/dL
KETONES UR: NEGATIVE mg/dL
LEUKOCYTES UA: NEGATIVE
NITRITE: NEGATIVE
PROTEIN: NEGATIVE mg/dL
Specific Gravity, Urine: 1.016 (ref 1.005–1.030)
Urobilinogen, UA: 0.2 mg/dL (ref 0.0–1.0)
pH: 5 (ref 5.0–8.0)

## 2013-09-21 LAB — WET PREP, GENITAL
TRICH WET PREP: NONE SEEN
YEAST WET PREP: NONE SEEN

## 2013-09-21 LAB — POC URINE PREG, ED: Preg Test, Ur: NEGATIVE

## 2013-09-21 LAB — URINE MICROSCOPIC-ADD ON

## 2013-09-21 MED ORDER — DEXTROSE 5 % IV SOLN
1.0000 g | Freq: Once | INTRAVENOUS | Status: DC
  Filled 2013-09-21: qty 10

## 2013-09-21 MED ORDER — ONDANSETRON HCL 4 MG/2ML IJ SOLN
4.0000 mg | Freq: Once | INTRAMUSCULAR | Status: AC
  Administered 2013-09-21: 4 mg via INTRAVENOUS
  Filled 2013-09-21: qty 2

## 2013-09-21 MED ORDER — HYDROCODONE-ACETAMINOPHEN 5-325 MG PO TABS: 2.0000 | ORAL_TABLET | ORAL | Status: DC | PRN

## 2013-09-21 MED ORDER — KETOROLAC TROMETHAMINE 30 MG/ML IJ SOLN
30.0000 mg | Freq: Once | INTRAMUSCULAR | Status: AC
  Administered 2013-09-21: 30 mg via INTRAVENOUS
  Filled 2013-09-21: qty 1

## 2013-09-21 MED ORDER — SODIUM CHLORIDE 0.9 % IV BOLUS (SEPSIS)
1000.0000 mL | Freq: Once | INTRAVENOUS | Status: AC
Start: 2013-09-21 — End: 2013-09-21
  Administered 2013-09-21: 1000 mL via INTRAVENOUS

## 2013-09-21 NOTE — ED Notes (Signed)
Pt c/o bilateral flank and back pain x 3 days; pt sts burning with urination; pt sts hx of stents to kidneys

## 2013-09-21 NOTE — Discharge Instructions (Signed)

## 2013-09-21 NOTE — ED Provider Notes (Signed)
CSN: [id number]     Arrival date & time 09/21/13  1009 History   First MD Initiated Contact with Patient 09/21/13 1044     Chief Complaint  Patient presents with  . Flank Pain  . Back Pain  . Dysuria     (Consider location/radiation/quality/duration/timing/severity/associated sxs/prior Treatment) HPI Comments: Patient is a 43 year old female with a past medical history of hypertension, CAD, and renal artery stenosis who presents with bilateral flank pain for the past week. The pain is located in bilateral flanks and radiates around to her lower abdomen. The pain is described as aching and severe. The pain started gradually and progressively worsened since the onset. No alleviating/aggravating factors. The patient has tried OTC medications for symptoms without relief. Associated symptoms include dysuria. Patient denies fever, headache, NVD, chest pain, SOB, constipation, abnormal vaginal bleeding/discharge.     Patient is a 43 y.o. female presenting with flank pain, back pain, and dysuria.  Flank Pain Pertinent negatives include no abdominal pain, arthralgias, chest pain, chills, fatigue, fever, nausea, neck pain, vomiting or weakness.  Back Pain Associated symptoms: dysuria   Associated symptoms: no abdominal pain, no chest pain, no fever and no weakness   Dysuria Associated symptoms: flank pain   Associated symptoms: no abdominal pain, no fever, no nausea and no vomiting     Past Medical History  Diagnosis Date  . Hypertension   . Chronic back pain     Associated with occasional leg numbness  . Leg numbness   . Anxiety   . CAD (coronary artery disease)     a. MI/RCA stent 04/2010 in [place].  . Renal artery stenosis of unknown cause     Reported bilateral renal artery stents in 2012, not seen on angiogram 12/2010  . Lymphadenopathy, inguinal     RLE lymphadenopathy on PV dopplers 12/2010   Past Surgical History  Procedure Laterality Date  . Coronary stent placement  2011   . Kidney stent placement  07/2010   Family History  Problem Relation Age of Onset  . Hypertension Mother   . Hypertension Brother   . Hypertension Paternal Grandmother   . Hyperlipidemia Paternal Grandmother   . Stroke Paternal Grandmother   . Kidney disease Paternal Grandmother   . CAD Paternal Grandmother     stent and pacemaker   History  Substance Use Topics  . Smoking status: Current Every Day Smoker -- 1.00 packs/day for 15 years    Types: Cigarettes  . Smokeless tobacco: Not on file  . Alcohol Use: Yes     Comment: 4-5 beers once-twice week   OB History   Grav Para Term Preterm Abortions TAB SAB Ect Mult Living                 Review of Systems  Constitutional: Negative for fever, chills and fatigue.  HENT: Negative for trouble swallowing.   Eyes: Negative for visual disturbance.  Respiratory: Negative for shortness of breath.   Cardiovascular: Negative for chest pain and palpitations.  Gastrointestinal: Negative for nausea, vomiting, abdominal pain and diarrhea.  Genitourinary: Positive for dysuria and flank pain. Negative for difficulty urinating.  Musculoskeletal: Positive for back pain. Negative for arthralgias and neck pain.  Skin: Negative for color change.  Neurological: Negative for dizziness and weakness.  Psychiatric/Behavioral: Negative for dysphoric mood.      Allergies  Review of patient's allergies indicates no known allergies.  Home Medications   Current Outpatient Rx  Name  Route  Sig  Dispense  Refill  . amLODipine (NORVASC) 10 MG tablet   Oral   Take 10 mg by mouth daily.           . clopidogrel (PLAVIX) 75 MG tablet   Oral   Take 75 mg by mouth daily.         Marland Kitchen HYDROcodone-acetaminophen (NORCO/VICODIN) 5-325 MG per tablet   Oral   Take 1 tablet by mouth every 6 (six) hours as needed for moderate pain.         Marland Kitchen ibuprofen (ADVIL,MOTRIN) 600 MG tablet   Oral   Take 600 mg by mouth every 8 (eight) hours as needed (for  pain).         . nitroGLYCERIN (NITROSTAT) 0.4 MG SL tablet   Sublingual   Place 0.4 mg under the tongue every 5 (five) minutes as needed. For chest pain         . risperiDONE (RISPERDAL) 1 MG tablet   Oral   Take 1 mg by mouth 2 (two) times daily. Supposed to take three times a day but only takes once daily as needed         . simvastatin (ZOCOR) 40 MG tablet   Oral   Take 40 mg by mouth at bedtime.            BP 154/104  Pulse 72  Temp(Src) 98.1 F (36.7 C) (Oral)  Resp 18  Ht 5\' 6"  (1.676 m)  Wt 135 lb (61.236 kg)  BMI 21.80 kg/m2  SpO2 100% Physical Exam  Nursing note and vitals reviewed. Constitutional: She is oriented to person, place, and time. She appears well-developed and well-nourished. No distress.  HENT:  Head: Normocephalic and atraumatic.  Eyes: Conjunctivae and EOM are normal.  Neck: Normal range of motion.  Cardiovascular: Normal rate and regular rhythm.  Exam reveals no gallop and no friction rub.   No murmur heard. Pulmonary/Chest: Effort normal and breath sounds normal. She has no wheezes. She has no rales. She exhibits no tenderness.  Abdominal: Soft. She exhibits no distension. There is no tenderness. There is no rebound and no guarding.  Suprapubic tenderness to palpation. No other focal tenderness or peritoneal signs.   Genitourinary:  Bilateral CVA tenderness.   Musculoskeletal: Normal range of motion.  Neurological: She is alert and oriented to person, place, and time. Coordination normal.  Speech is goal-oriented. Moves limbs without ataxia.   Skin: Skin is warm and dry.  Psychiatric: She has a normal mood and affect. Her behavior is normal.    ED Course  Procedures (including critical care time) Labs Review Labs Reviewed  WET PREP, GENITAL - Abnormal; Notable for the following:    Clue Cells Wet Prep HPF POC MODERATE (*)    WBC, Wet Prep HPF POC FEW (*)    All other components within normal limits  CBC WITH DIFFERENTIAL -  Abnormal; Notable for the following:    WBC 11.6 (*)    Neutrophils Relative % 81 (*)    Neutro Abs 9.3 (*)    All other components within normal limits  COMPREHENSIVE METABOLIC PANEL - Abnormal; Notable for the following:    Glucose, Bld 118 (*)    GFR calc non Af Amer 83 (*)    All other components within normal limits  URINALYSIS, ROUTINE W REFLEX MICROSCOPIC - Abnormal; Notable for the following:    APPearance CLOUDY (*)    Hgb urine dipstick TRACE (*)    All other components within normal  limits  URINE MICROSCOPIC-ADD ON - Abnormal; Notable for the following:    Squamous Epithelial / LPF MANY (*)    Bacteria, UA FEW (*)    All other components within normal limits  GC/CHLAMYDIA PROBE AMP  URINE CULTURE  POC URINE PREG, ED   Imaging Review Ct Abdomen Pelvis Wo Contrast  09/21/2013   CLINICAL DATA:  Bilateral flank pain.  Dysuria.  EXAM: CT ABDOMEN AND PELVIS WITHOUT CONTRAST  TECHNIQUE: Multidetector CT imaging of the abdomen and pelvis was performed following the standard protocol without intravenous contrast.  COMPARISON:  None.  FINDINGS: A few tiny nonobstructive calculi measuring 1-2 mm noted in the lower pole of the left kidney. No evidence of hydronephrosis. No evidence of ureteral calculi or dilatation. No bladder calculi identified.  A septated cystic lesion is seen in the left adnexa which measures approximately 4.5 x 6.8 cm on image 64. This contains at least 1 internal fold or septation, and has a somewhat tubular appearance suspicious for hydrosalpinx although a cystic ovarian lesion cannot be excluded. A smaller benign appearing cystic lesion is seen in the right adnexa measuring 2.7 x 3.3 cm on image 66. No evidence of free fluid.  No other soft tissue masses or lymphadenopathy identified within the abdomen or pelvis. Noncontrast images of the liver, gallbladder, pancreas, spleen, and kidneys are normal in appearance. No evidence of inflammatory process or dilated bowel  loops.  IMPRESSION: Indeterminate cystic lesion in the left adnexa measuring 4.5 x 6.8 cm. This has features suspicious for a hydrosalpinx, although a cystic ovarian lesion cannot be excluded. Benign appearing cyst in right adnexa is likely ovarian. Pelvic ultrasound recommended for further evaluation.  Nonobstructive left nephrolithiasis. No evidence of ureteral calculi or hydronephrosis.   Electronically Signed   By: Earle Gell M.D.   On: 09/21/2013 14:34   US Transvaginal Non-ob  09/21/2013   CLINICAL DATA:  Bilateral flank pain and dysuria. Cystic adnexal lesion seen on recent CT.  EXAM: TRANSABDOMINAL AND TRANSVAGINAL ULTRASOUND OF PELVIS  TECHNIQUE: Both transabdominal and transvaginal ultrasound examinations of the pelvis were performed. Transabdominal technique was performed for global imaging of the pelvis including uterus, ovaries, adnexal regions, and pelvic cul-de-sac. It was necessary to proceed with endovaginal exam following the transabdominal exam to visualize the ovaries and cystic lesions in both adnexae.  COMPARISON:  CT on 09/21/2013  FINDINGS: Uterus  Measurements: 9.0 x 4.7 x 5.7 cm. Two small fibroids are seen in the anterior uterine body which measure 1.2 cm and 1.1 cm in maximum diameter. No other definite fibroids visualized although myometrium is heterogeneous.  Endometrium  Thickness: 8 mm.  No focal abnormality visualized.  Right ovary  Measurements: 4.7 x 3.0 x 4.7 cm. A 3.5 cm simple appearing cyst or follicle is seen. In addition, there is a 1.6 cm hypoechoic area without internal blood flow, consistent with a small corpus luteum.  Left ovary  Measurements: 3.9 x 1.3 x 2.3 cm. No ovarian mass identified. Adjacent to the left ovary is a tubular cystic lesion which measures approximately 2.3 x 6.4 cm and is consistent with a hydrosalpinx. This corresponds to the cystic lesion seen on previous CT.  Other findings  Small amount of free fluid in pelvic cul-de-sac.  IMPRESSION: Moderate  left hydrosalpinx, which corresponds with the cystic lesion in left adnexa on recent CT. Small amount of free fluid also noted. No left ovarian mass identified.  Benign-appearing right ovarian follicle and corpus luteum.  Two small uterine fibroids,  largest measuring 1.2 cm.   Electronically Signed   By: Earle Gell M.D.   On: 09/21/2013 16:35   US Pelvis Complete  09/21/2013   CLINICAL DATA:  Bilateral flank pain and dysuria. Cystic adnexal lesion seen on recent CT.  EXAM: TRANSABDOMINAL AND TRANSVAGINAL ULTRASOUND OF PELVIS  TECHNIQUE: Both transabdominal and transvaginal ultrasound examinations of the pelvis were performed. Transabdominal technique was performed for global imaging of the pelvis including uterus, ovaries, adnexal regions, and pelvic cul-de-sac. It was necessary to proceed with endovaginal exam following the transabdominal exam to visualize the ovaries and cystic lesions in both adnexae.  COMPARISON:  CT on 09/21/2013  FINDINGS: Uterus  Measurements: 9.0 x 4.7 x 5.7 cm. Two small fibroids are seen in the anterior uterine body which measure 1.2 cm and 1.1 cm in maximum diameter. No other definite fibroids visualized although myometrium is heterogeneous.  Endometrium  Thickness: 8 mm.  No focal abnormality visualized.  Right ovary  Measurements: 4.7 x 3.0 x 4.7 cm. A 3.5 cm simple appearing cyst or follicle is seen. In addition, there is a 1.6 cm hypoechoic area without internal blood flow, consistent with a small corpus luteum.  Left ovary  Measurements: 3.9 x 1.3 x 2.3 cm. No ovarian mass identified. Adjacent to the left ovary is a tubular cystic lesion which measures approximately 2.3 x 6.4 cm and is consistent with a hydrosalpinx. This corresponds to the cystic lesion seen on previous CT.  Other findings  Small amount of free fluid in pelvic cul-de-sac.  IMPRESSION: Moderate left hydrosalpinx, which corresponds with the cystic lesion in left adnexa on recent CT. Small amount of free fluid also  noted. No left ovarian mass identified.  Benign-appearing right ovarian follicle and corpus luteum.  Two small uterine fibroids, largest measuring 1.2 cm.   Electronically Signed   By: Earle Gell M.D.   On: 09/21/2013 16:35     EKG Interpretation None      MDM   Final diagnoses:  Flank pain  Hydrosalpinx    12:40 PM Patient's labs show mildly elevated WBCs. Urinalysis shows few bacteria and many squamous epithelial cells. Patient will have CT abdomen pelvis to evaluate for kidney stone. Vitals stable and patient afebrile.   Patient shown to have hydrosalpinx on CT without any other acute changes to account for her back pain. Patient had a pelvic and will now have pelvic US. Patient signed out to Dr. Ernestina Patches.  Alvina Chou, [place] 09/22/13 [phone]

## 2013-09-22 LAB — URINE CULTURE
CULTURE: NO GROWTH
Colony Count: NO GROWTH

## 2013-09-22 LAB — GC/CHLAMYDIA PROBE AMP
CT PROBE, AMP APTIMA: NEGATIVE
GC PROBE AMP APTIMA: NEGATIVE

## 2013-09-24 NOTE — ED Provider Notes (Signed)
Medical screening examination/treatment/procedure(s) were conducted as a shared visit with non-physician practitioner(s) and myself.  I personally evaluated the patient during the encounter.   EKG Interpretation None      Pt c/o bil flank pain posteriorly/back pain, esp L. Denies fever or chills. No dysuria. Spine nt. abd soft nt. Labs. Imaging.   Mirna Mires, MD 09/24/13 [phone]

## 2013-10-05 ENCOUNTER — Other Ambulatory Visit: Payer: Self-pay | Admitting: *Deleted

## 2013-10-05 DIAGNOSIS — I10 Essential (primary) hypertension: Secondary | ICD-10-CM

## 2013-10-05 DIAGNOSIS — I701 Atherosclerosis of renal artery: Secondary | ICD-10-CM

## 2013-10-12 ENCOUNTER — Encounter: Payer: Self-pay | Admitting: Vascular Surgery

## 2013-10-13 ENCOUNTER — Ambulatory Visit (INDEPENDENT_AMBULATORY_CARE_PROVIDER_SITE_OTHER): Payer: Medicaid Other | Admitting: Vascular Surgery

## 2013-10-13 ENCOUNTER — Other Ambulatory Visit: Payer: Self-pay | Admitting: Vascular Surgery

## 2013-10-13 ENCOUNTER — Encounter: Payer: Self-pay | Admitting: Vascular Surgery

## 2013-10-13 ENCOUNTER — Ambulatory Visit (HOSPITAL_COMMUNITY)
Admission: RE | Admit: 2013-10-13 | Discharge: 2013-10-13 | Disposition: A | Discharge status: Home / Self Care | Payer: Medicaid Other | Source: Ambulatory Visit | Attending: Vascular Surgery | Admitting: Vascular Surgery

## 2013-10-13 VITALS — BP 169/94 | HR 71 | Ht 66.0 in | Wt 135.5 lb

## 2013-10-13 DIAGNOSIS — I701 Atherosclerosis of renal artery: Secondary | ICD-10-CM

## 2013-10-13 DIAGNOSIS — I10 Essential (primary) hypertension: Secondary | ICD-10-CM

## 2013-10-13 DIAGNOSIS — Z48812 Encounter for surgical aftercare following surgery on the circulatory system: Secondary | ICD-10-CM

## 2013-10-13 HISTORY — DX: Atherosclerosis of renal artery: I70.1

## 2013-10-13 NOTE — Progress Notes (Signed)
Patient name: Laura Garrett MRN: [id number] DOB: Oct 28, 1970 Sex: female   Referred by: Alroy Dust  Reason for referral:  Chief Complaint  Patient presents with  . New Evaluation    hx of renal stent 07/2010    HISTORY OF PRESENT ILLNESS: Is 43 year old female with a history of prior coronary artery angioplasty and also renal artery angioplasty at [facility] 2012. In discussing this with the patient she reports that she did not have any known renal insufficiency or uncontrolled hypertension. She had a myocardial infarction during a workup with this with her cardiac cath was found to have incidentally renal artery stenosis and underwent stenting of this as well. She's had no followup since that time. She does continue to have essential hypertension but no severe hypertension. She has no history of renal insufficiency. She recently had back pain and was sent for followup of her renal stent. She reports that since that time she passed a kidney stones and has had complete resolution of her pain. She does report that she's been stable from a cardiac standpoint since her myocardial infarction  Past Medical History  Diagnosis Date  . Hypertension   . Chronic back pain     Associated with occasional leg numbness  . Leg numbness   . Anxiety   . CAD (coronary artery disease)     a. MI/RCA stent 04/2010 in [place].  . Renal artery stenosis of unknown cause     Reported bilateral renal artery stents in 2012, not seen on angiogram 12/2010  . Lymphadenopathy, inguinal     RLE lymphadenopathy on PV dopplers 12/2010    Past Surgical History  Procedure Laterality Date  . Coronary stent placement  2011  . Kidney stent placement  07/2010    History   Social History  . Marital Status: Single    Spouse Name: N/A    Number of Children: N/A  . Years of Education: N/A   Occupational History  . Not on file.   Social History Main Topics  . Smoking status: Current Every  Day Smoker -- 1.00 packs/day for 15 years    Types: Cigarettes  . Smokeless tobacco: Not on file  . Alcohol Use: Yes     Comment: 4-5 beers once-twice week  . Drug Use: Yes     Comment: Daily marijuana  . Sexual Activity: Not on file   Other Topics Concern  . Not on file   Social History Narrative  . No narrative on file    Family History  Problem Relation Age of Onset  . Hypertension Mother   . Hypertension Brother   . Hypertension Paternal Grandmother   . Hyperlipidemia Paternal Grandmother   . Stroke Paternal Grandmother   . Kidney disease Paternal Grandmother   . CAD Paternal Grandmother     stent and pacemaker    Allergies as of 10/13/2013 - Review Complete 10/13/2013  Allergen Reaction Noted  . Latex  10/13/2013    Current Outpatient Prescriptions on File Prior to Visit  Medication Sig Dispense Refill  . amLODipine (NORVASC) 10 MG tablet Take 10 mg by mouth daily.        . clopidogrel (PLAVIX) 75 MG tablet Take 75 mg by mouth daily.      Marland Kitchen HYDROcodone-acetaminophen (NORCO) 5-325 MG per tablet Take 2 tablets by mouth every 4 (four) hours as needed.  10 tablet  0  . ibuprofen (ADVIL,MOTRIN) 600 MG tablet Take 600 mg by mouth every  8 (eight) hours as needed (for pain).      . nitroGLYCERIN (NITROSTAT) 0.4 MG SL tablet Place 0.4 mg under the tongue every 5 (five) minutes as needed. For chest pain      . risperiDONE (RISPERDAL) 1 MG tablet Take 1 mg by mouth 2 (two) times daily. Supposed to take three times a day but only takes once daily as needed      . simvastatin (ZOCOR) 40 MG tablet Take 40 mg by mouth at bedtime.        Marland Kitchen HYDROcodone-acetaminophen (NORCO/VICODIN) 5-325 MG per tablet Take 1 tablet by mouth every 6 (six) hours as needed for moderate pain.       No current facility-administered medications on file prior to visit.     REVIEW OF SYSTEMS:  Positives indicated with an "X"  CARDIOVASCULAR:  [ ]  chest pain   [ ]  chest pressure   [ ]  palpitations   [  ] orthopnea   [ ]  dyspnea on exertion   [ ]  claudication   [ ]  rest pain   [ ]  DVT   [ ]  phlebitis PULMONARY:   [ ]  productive cough   [ ]  asthma   [ ]  wheezing NEUROLOGIC:   [ ]  weakness  [ ]  paresthesias  [ ]  aphasia  [ ]  amaurosis  [ ]  dizziness HEMATOLOGIC:   [ ]  bleeding problems   [ ]  clotting disorders MUSCULOSKELETAL:  [ ]  joint pain   [ ]  joint swelling GASTROINTESTINAL: [ ]   blood in stool  [ ]   hematemesis GENITOURINARY:  [ ]   dysuria  [ ]   hematuria PSYCHIATRIC:  [x ] history of  depression INTEGUMENTARY:  [ ]  rashes  [ ]  ulcers CONSTITUTIONAL:  [ ]  fever   [ ]  chills  PHYSICAL EXAMINATION:  General: The patient is a well-nourished female, in no acute distress. Vital signs are BP 169/94  Pulse 71  Ht 5\' 6"  (1.676 m)  Wt 135 lb 8 oz (61.462 kg)  BMI 21.88 kg/m2  SpO2 100%  LMP 08/24/2013 Pulmonary: There is a good air exchange bilaterally without wheezing or rales. Abdomen: Soft and non-tender with normal pitch bowel sounds. Musculoskeletal: There are no major deformities.  There is no significant extremity pain. Neurologic: No focal weakness or paresthesias are detected, Skin: There are no ulcer or rashes noted. Psychiatric: The patient has normal affect. Cardiovascular: There is a regular rate and rhythm without significant murmur appreciated.  no abdominal bruits present Carotid arteries without bruits bilaterally 2+ radial and 2+ dorsalis pedis pulses  VVS Vascular Lab Studies:  Ordered and Independently Reviewed this reveals into both middle renal arteries with no evidence of stenosis. There is some tortuosity in the left renal artery but no evidence of a severe stenosis.  Impression and Plan:  History of renal artery stent for incidental finding of stenosis at [facility]. Details of this are unavailable. Duplex today shows no evidence of critical renal artery stenosis. I discussed this at length with the patient and her daughter present. I would  not recommend any further followup for evaluation of a she had renal insufficiency or uncontrolled hypertension. She will see [place] on an as-needed basis    Arvilla Meres Ronica Vivian [facility] Office: [phone]

## 2015-12-02 ENCOUNTER — Encounter (HOSPITAL_COMMUNITY): Payer: Self-pay | Admitting: *Deleted

## 2015-12-02 ENCOUNTER — Emergency Department (HOSPITAL_COMMUNITY)
Admission: EM | Admit: 2015-12-02 | Discharge: 2015-12-02 | Discharge status: Against Medical Advice | Payer: Medicaid Other | Attending: Emergency Medicine | Admitting: Emergency Medicine

## 2015-12-02 ENCOUNTER — Other Ambulatory Visit: Payer: Self-pay

## 2015-12-02 ENCOUNTER — Emergency Department (HOSPITAL_COMMUNITY): Payer: Medicaid Other

## 2015-12-02 DIAGNOSIS — I10 Essential (primary) hypertension: Secondary | ICD-10-CM | POA: Diagnosis not present

## 2015-12-02 DIAGNOSIS — F17210 Nicotine dependence, cigarettes, uncomplicated: Secondary | ICD-10-CM | POA: Diagnosis not present

## 2015-12-02 DIAGNOSIS — I2510 Atherosclerotic heart disease of native coronary artery without angina pectoris: Secondary | ICD-10-CM | POA: Insufficient documentation

## 2015-12-02 DIAGNOSIS — Z7902 Long term (current) use of antithrombotics/antiplatelets: Secondary | ICD-10-CM | POA: Diagnosis not present

## 2015-12-02 DIAGNOSIS — R079 Chest pain, unspecified: Secondary | ICD-10-CM

## 2015-12-02 DIAGNOSIS — Z91040 Latex allergy status: Secondary | ICD-10-CM | POA: Insufficient documentation

## 2015-12-02 DIAGNOSIS — R0789 Other chest pain: Secondary | ICD-10-CM | POA: Diagnosis present

## 2015-12-02 LAB — BASIC METABOLIC PANEL
ANION GAP: 5 (ref 5–15)
BUN: 9 mg/dL (ref 6–20)
CALCIUM: 9.7 mg/dL (ref 8.9–10.3)
CHLORIDE: 108 mmol/L (ref 101–111)
CO2: 25 mmol/L (ref 22–32)
Creatinine, Ser: 1.01 mg/dL — ABNORMAL HIGH (ref 0.44–1.00)
GFR calc non Af Amer: >60 mL/min (ref >60)
Glucose, Bld: 94 mg/dL (ref 65–99)
POTASSIUM: 4.5 mmol/L (ref 3.5–5.1)
Sodium: 138 mmol/L (ref 135–145)

## 2015-12-02 LAB — CBC
HEMATOCRIT: 39.2 % (ref 36.0–46.0)
HEMOGLOBIN: 13.1 g/dL (ref 12.0–15.0)
MCH: 31.6 pg (ref 26.0–34.0)
MCHC: 33.4 g/dL (ref 30.0–36.0)
MCV: 94.5 fL (ref 78.0–100.0)
Platelets: 270 10*3/uL (ref 150–400)
RBC: 4.15 MIL/uL (ref 3.87–5.11)
RDW: 14.3 % (ref 11.5–15.5)
WBC: 11.6 10*3/uL — ABNORMAL HIGH (ref 4.0–10.5)

## 2015-12-02 LAB — D-DIMER, QUANTITATIVE: D-Dimer, Quant: 0.85 ug/mL-FEU — ABNORMAL HIGH (ref 0.00–0.50)

## 2015-12-02 LAB — I-STAT TROPONIN, ED: TROPONIN I, POC: 0.01 ng/mL (ref 0.00–0.08)

## 2015-12-02 MED ORDER — ASPIRIN 81 MG PO CHEW: 324.0000 mg | CHEWABLE_TABLET | Freq: Once | ORAL | Status: DC

## 2015-12-02 MED ORDER — NITROGLYCERIN 0.4 MG SL SUBL: 0.4000 mg | SUBLINGUAL_TABLET | SUBLINGUAL | Status: DC | PRN

## 2015-12-02 NOTE — ED Notes (Signed)
Pt reports onset this am of mid chest pain that radiates into her arm, pain increases when breathing. Denies recent cough. ekg done.

## 2015-12-02 NOTE — ED Provider Notes (Signed)
CSN: [id number]     Arrival date & time 12/02/15  1031 History   First MD Initiated Contact with Patient 12/02/15 1105     Chief Complaint  Patient presents with  . Chest Pain     Patient is a 45 y.o. female presenting with chest pain. The history is provided by the patient. No language interpreter was used.  Chest Pain  Laura Garrett is a 45 y.o. female who presents to the Emergency Department complaining of chest pain.  She reports right arm pain and central chest pain. Right arm pain has been intermittent for the last several days. Her chest pains been constant for the last hour. Pain is described as a heavy pressure type sensation. She has associated shortness of breath and worsening sharp central chest pain with deep breath. She denies any fevers, bone pain, vomiting, leg swelling or pain. She has a history of coronary artery disease as well as renal artery stenosis status post stenting. She did take her medications today. She continues to smoke. Symptoms are moderate, constant and worsening.  Past Medical History  Diagnosis Date  . Hypertension   . Chronic back pain     Associated with occasional leg numbness  . Leg numbness   . Anxiety   . CAD (coronary artery disease)     a. MI/RCA stent 04/2010 in [place].  . Renal artery stenosis of unknown cause ([place])     Reported bilateral renal artery stents in 2012, not seen on angiogram 12/2010  . Lymphadenopathy, inguinal     RLE lymphadenopathy on PV dopplers 12/2010   Past Surgical History  Procedure Laterality Date  . Coronary stent placement  2011  . Kidney stent placement  07/2010   Family History  Problem Relation Age of Onset  . Hypertension Mother   . Hypertension Brother   . Hypertension Paternal Grandmother   . Hyperlipidemia Paternal Grandmother   . Stroke Paternal Grandmother   . Kidney disease Paternal Grandmother   . CAD Paternal Grandmother     stent and pacemaker   Social History  Substance Use Topics   . Smoking status: Current Every Day Smoker -- 1.00 packs/day for 15 years    Types: Cigarettes  . Smokeless tobacco: None  . Alcohol Use: Yes     Comment: 4-5 beers once-twice week   OB History    No data available     Review of Systems  Cardiovascular: Positive for chest pain.  All other systems reviewed and are negative.     Allergies  Latex  Home Medications   Prior to Admission medications   Medication Sig Start Date End Date Taking? Authorizing Provider  amLODipine (NORVASC) 10 MG tablet Take 10 mg by mouth daily.      Historical Provider, MD  clopidogrel (PLAVIX) 75 MG tablet Take 75 mg by mouth daily.    Historical Provider, MD  HYDROcodone-acetaminophen (NORCO) 5-325 MG per tablet Take 2 tablets by mouth every 4 (four) hours as needed. 09/21/13   Ernestina Patches, MD  HYDROcodone-acetaminophen (NORCO/VICODIN) 5-325 MG per tablet Take 1 tablet by mouth every 6 (six) hours as needed for moderate pain.    Historical Provider, MD  ibuprofen (ADVIL,MOTRIN) 600 MG tablet Take 600 mg by mouth every 8 (eight) hours as needed (for pain).    Historical Provider, MD  nitroGLYCERIN (NITROSTAT) 0.4 MG SL tablet Place 0.4 mg under the tongue every 5 (five) minutes as needed. For chest pain    Historical Provider,  MD  risperiDONE (RISPERDAL) 1 MG tablet Take 1 mg by mouth 2 (two) times daily. Supposed to take three times a day but only takes once daily as needed    Historical Provider, MD  simvastatin (ZOCOR) 40 MG tablet Take 40 mg by mouth at bedtime.      Historical Provider, MD   BP 183/102 mmHg  Pulse 73  Temp(Src) 98 F (36.7 C) (Oral)  Resp 18  SpO2 100%  LMP 11/10/2015 Physical Exam  Constitutional: She is oriented to person, place, and time. She appears well-developed and well-nourished.  HENT:  Head: Normocephalic and atraumatic.  Cardiovascular: Normal rate and regular rhythm.   No murmur heard. Pulmonary/Chest: Effort normal and breath sounds normal. No respiratory  distress.  Abdominal: Soft. There is no tenderness. There is no rebound and no guarding.  Musculoskeletal: She exhibits no edema or tenderness.  Neurological: She is alert and oriented to person, place, and time.  Skin: Skin is warm and dry.  Psychiatric: She has a normal mood and affect. Her behavior is normal.  Nursing note and vitals reviewed.   ED Course  Procedures (including critical care time) Labs Review Labs Reviewed  BASIC METABOLIC PANEL - Abnormal; Notable for the following:    Creatinine, Ser 1.01 (*)    All other components within normal limits  CBC - Abnormal; Notable for the following:    WBC 11.6 (*)    All other components within normal limits  D-DIMER, QUANTITATIVE (NOT AT [facility]) - Abnormal; Notable for the following:    D-Dimer, Quant 0.85 (*)    All other components within normal limits  I-STAT TROPOININ, ED    Imaging Review Dg Chest 2 View  12/02/2015  CLINICAL DATA:  Pt reports onset this am of mid chest pain that radiates into her arm, pain increases when breathing. Denies recent cough. Smoker,. Diabetes. Coronary artery disease. Hypertension. EXAM: CHEST  2 VIEW COMPARISON:  09/18/2013 FINDINGS: Midline trachea. Normal heart size and mediastinal contours. No pleural effusion or pneumothorax. Lower lobe predominant pulmonary interstitial thickening is significantly increased. No well-defined consolidation. EKG lead artifacts project over the upper lobes bilaterally. IMPRESSION: Pulmonary interstitial thickening which is progressive. Although this could relate to a viral or atypical bacterial infection, given absence of infection symptoms, is favored to be related to smoking/chronic bronchitis. Otherwise, no acute disease. Electronically Signed   By: Abigail Miyamoto M.D.   On: 12/02/2015 10:57   I have personally reviewed and evaluated these images and lab results as part of my medical decision-making.   EKG Interpretation   Date/Time:  Friday December 02 2015  10:35:22 EDT Ventricular Rate:  82 PR Interval:  144 QRS Duration: 90 QT Interval:  394 QTC Calculation: 460 R Axis:   80 Text Interpretation:  Normal sinus rhythm Left ventricular hypertrophy  Abnormal ECG Confirmed by Hazle Coca [phone]) on 12/02/2015 10:45:25 AM      MDM   Final diagnoses:  Chest pain, unspecified chest pain type  Patient here for evaluation of chest pain and shortness of breath. She is said to be hypertensive on the ED arrival. Discussed treatment plan for checking additional labs. Treating her pain and her blood pressure. Patient eloped from the emergency department without being able to reassess her discuss concerns.  Quintella Reichert, MD 12/02/15 [phone]

## 2015-12-02 NOTE — ED Notes (Signed)
IV attempted at r forearm and pt becomes aggitated. Pt states I have to stop iv attempt and if I can't promise I will get placement she will leave.  Pt diaphoretic. I urged pt to stay and told her leaving could be dangerous and offered to get another nurse and Dr. Ralene Bathe.Pt adamant she is leaving. Dr. Ralene Bathe notified but pt noted as walking out with her father before dr. Ralene Bathe could return to room. Pt noted walking out at 1133

## 2016-03-23 DIAGNOSIS — Z9889 Other specified postprocedural states: Secondary | ICD-10-CM

## 2016-03-23 DIAGNOSIS — F17210 Nicotine dependence, cigarettes, uncomplicated: Secondary | ICD-10-CM

## 2016-03-23 HISTORY — DX: Other specified postprocedural states: Z98.890

## 2016-03-23 HISTORY — DX: Nicotine dependence, cigarettes, uncomplicated: F17.210

## 2016-08-27 ENCOUNTER — Ambulatory Visit (HOSPITAL_COMMUNITY)
Admission: EM | Admit: 2016-08-27 | Discharge: 2016-08-27 | Disposition: A | Discharge status: Home / Self Care | Payer: Medicaid Other | Attending: Family Medicine | Admitting: Family Medicine

## 2016-08-27 ENCOUNTER — Encounter (HOSPITAL_COMMUNITY): Payer: Self-pay | Admitting: Emergency Medicine

## 2016-08-27 DIAGNOSIS — I10 Essential (primary) hypertension: Secondary | ICD-10-CM | POA: Diagnosis not present

## 2016-08-27 DIAGNOSIS — H00015 Hordeolum externum left lower eyelid: Secondary | ICD-10-CM | POA: Diagnosis not present

## 2016-08-27 MED ORDER — SIMVASTATIN 40 MG PO TABS: 40.0000 mg | ORAL_TABLET | Freq: Every day | ORAL | 3 refills | Status: DC

## 2016-08-27 MED ORDER — AMLODIPINE BESYLATE 10 MG PO TABS: 10.0000 mg | ORAL_TABLET | Freq: Every day | ORAL | 3 refills | Status: DC

## 2016-08-27 MED ORDER — AMOXICILLIN-POT CLAVULANATE 875-125 MG PO TABS: 1.0000 | ORAL_TABLET | Freq: Two times a day (BID) | ORAL | 0 refills | Status: DC

## 2016-08-27 NOTE — ED Triage Notes (Signed)
Patient reports recently moved, doing a lot of cleaning and mold in new home.  Eye lid is involved, not the eye symptoms started Friday.  Pain, redness, swelling of lower left lower eyelid

## 2016-08-27 NOTE — ED Provider Notes (Signed)
[place]    CSN: [id number] Arrival date & time: 08/27/16  1000     History   Chief Complaint Chief Complaint  Patient presents with  . Eye Pain    HPI Laura Garrett is a 46 y.o. female.   This is a 46 year old woman, disabled from heart disease and hypertension, who presents with 3 days of swelling and tenderness in the lateral aspect of her left lower eyelid. She's been exposed to MRSA recently. She's had no change in her vision  Patient also has hypertension. She has no chest pain or headache but she's run out of her hypertension medicine and needs a doctor as well as a refill on her hypertension medicines.      Past Medical History:  Diagnosis Date  . Anxiety   . CAD (coronary artery disease)    a. MI/RCA stent 04/2010 in [place].  . Chronic back pain    Associated with occasional leg numbness  . Hypertension   . Leg numbness   . Lymphadenopathy, inguinal    RLE lymphadenopathy on PV dopplers 12/2010  . Renal artery stenosis of unknown cause ([place])    Reported bilateral renal artery stents in 2012, not seen on angiogram 12/2010    Patient Active Problem List   Diagnosis Date Noted  . Atherosclerosis of renal artery ([place]) 10/13/2013  . CAD (coronary artery disease) 12/29/2010  . HTN (hypertension) 12/29/2010  . Hyperlipidemia 12/29/2010  . Renal artery stenosis ([place]) 12/29/2010    Past Surgical History:  Procedure Laterality Date  . CORONARY STENT PLACEMENT  2011  . KIDNEY STENT PLACEMENT  07/2010    OB History    No data available       Home Medications    Prior to Admission medications   Medication Sig Start Date End Date Taking? Authorizing Provider  amLODipine (NORVASC) 10 MG tablet Take 1 tablet (10 mg total) by mouth daily. 08/27/16   Robyn Haber, MD  amoxicillin-clavulanate (AUGMENTIN) 875-125 MG tablet Take 1 tablet by mouth every 12 (twelve) hours. 08/27/16   Robyn Haber, MD  clopidogrel (PLAVIX) 75 MG tablet  Take 75 mg by mouth daily.    Historical Provider, MD  ibuprofen (ADVIL,MOTRIN) 600 MG tablet Take 600 mg by mouth every 8 (eight) hours as needed (for pain).    Historical Provider, MD  simvastatin (ZOCOR) 40 MG tablet Take 1 tablet (40 mg total) by mouth at bedtime. 08/27/16   Robyn Haber, MD    Family History Family History  Problem Relation Age of Onset  . Hypertension Mother   . Hypertension Brother   . Hypertension Paternal Grandmother   . Hyperlipidemia Paternal Grandmother   . Stroke Paternal Grandmother   . Kidney disease Paternal Grandmother   . CAD Paternal Grandmother     stent and pacemaker    Social History Social History  Substance Use Topics  . Smoking status: Current Every Day Smoker    Packs/day: 1.00    Years: 15.00    Types: Cigarettes  . Smokeless tobacco: Not on file  . Alcohol use Yes     Comment: 1-2 beers once-twice week     Allergies   Latex   Review of Systems Review of Systems  Constitutional: Negative.   Eyes: Positive for pain.  Respiratory: Negative.   Cardiovascular: Negative.   Neurological: Negative.      Physical Exam Triage Vital Signs ED Triage Vitals [08/27/16 1015]  Enc Vitals Group     BP Marland Kitchen)  196/104     Pulse Rate 73     Resp 18     Temp 97.6 F (36.4 C)     Temp Source Oral     SpO2 100 %     Weight      Height      Head Circumference      Peak Flow      Pain Score 6     Pain Loc      Pain Edu?      Excl. in [place]?    No data found.   Updated Vital Signs BP (!) 196/104 (BP Location: Right Arm)   Pulse 73   Temp 97.6 F (36.4 C) (Oral)   Resp 18   LMP 08/23/2016   SpO2 100%    Physical Exam  Constitutional: She is oriented to person, place, and time. She appears well-developed and well-nourished.  HENT:  Right Ear: External ear normal.  Left Ear: External ear normal.  Mouth/Throat: Oropharynx is clear and moist.  Eyes: Conjunctivae and EOM are normal. Pupils are equal, round, and reactive to  light.  Left lower eyelid reveals fluctuant purulence in the form of a stye.  Neck: Normal range of motion. Neck supple.  Cardiovascular: Normal rate.   Pulmonary/Chest: Effort normal.  Musculoskeletal: Normal range of motion.  Neurological: She is alert and oriented to person, place, and time.  Skin: Skin is warm and dry.  Nursing note and vitals reviewed.    UC Treatments / Results  Labs (all labs ordered are listed, but only abnormal results are displayed) Labs Reviewed - No data to display  EKG  EKG Interpretation None       Radiology No results found.  Procedures Procedures (including critical care time)  Medications Ordered in UC Medications - No data to display   Initial Impression / Assessment and Plan / UC Course  I have reviewed the triage vital signs and the nursing notes.  Pertinent labs & imaging results that were available during my care of the patient were reviewed by me and considered in my medical decision making (see chart for details).     Final Clinical Impressions(s) / UC Diagnoses   Final diagnoses:  Hordeolum externum of left lower eyelid  Essential hypertension    New Prescriptions New Prescriptions   AMOXICILLIN-CLAVULANATE (AUGMENTIN) 875-125 MG TABLET    Take 1 tablet by mouth every 12 (twelve) hours.     Robyn Haber, MD 08/27/16 1030

## 2016-08-27 NOTE — Discharge Instructions (Signed)
Apply hot moist compresses to the stye several times a day.  Follow-up your hypertension and heart disease to the [facility] in one month

## 2016-10-16 ENCOUNTER — Ambulatory Visit (INDEPENDENT_AMBULATORY_CARE_PROVIDER_SITE_OTHER): Payer: Medicaid Other | Admitting: Physician Assistant

## 2016-10-16 ENCOUNTER — Encounter (INDEPENDENT_AMBULATORY_CARE_PROVIDER_SITE_OTHER): Payer: Self-pay | Admitting: Physician Assistant

## 2016-10-16 VITALS — BP 170/100 | HR 68 | Temp 97.6°F | Ht 67.0 in | Wt 134.2 lb

## 2016-10-16 DIAGNOSIS — G4700 Insomnia, unspecified: Secondary | ICD-10-CM

## 2016-10-16 DIAGNOSIS — F319 Bipolar disorder, unspecified: Secondary | ICD-10-CM

## 2016-10-16 DIAGNOSIS — I10 Essential (primary) hypertension: Secondary | ICD-10-CM

## 2016-10-16 DIAGNOSIS — R102 Pelvic and perineal pain: Secondary | ICD-10-CM | POA: Diagnosis not present

## 2016-10-16 DIAGNOSIS — I701 Atherosclerosis of renal artery: Secondary | ICD-10-CM

## 2016-10-16 DIAGNOSIS — F3130 Bipolar disorder, current episode depressed, mild or moderate severity, unspecified: Secondary | ICD-10-CM

## 2016-10-16 DIAGNOSIS — I25119 Atherosclerotic heart disease of native coronary artery with unspecified angina pectoris: Secondary | ICD-10-CM

## 2016-10-16 HISTORY — DX: Pelvic and perineal pain: R10.2

## 2016-10-16 HISTORY — DX: Bipolar disorder, unspecified: F31.9

## 2016-10-16 HISTORY — DX: Insomnia, unspecified: G47.00

## 2016-10-16 MED ORDER — NITROGLYCERIN 0.4 MG SL SUBL
0.4000 mg | SUBLINGUAL_TABLET | SUBLINGUAL | 3 refills | Status: DC | PRN
Start: 2016-10-16 — End: 2017-12-10

## 2016-10-16 MED ORDER — QUETIAPINE FUMARATE 50 MG PO TABS: 50.0000 mg | ORAL_TABLET | ORAL | 1 refills | Status: DC

## 2016-10-16 MED ORDER — CLONIDINE HCL 0.1 MG PO TABS
0.2000 mg | ORAL_TABLET | Freq: Once | ORAL | Status: AC
  Administered 2016-10-16: 0.2 mg via ORAL

## 2016-10-16 MED ORDER — LOSARTAN POTASSIUM 50 MG PO TABS: 50.0000 mg | ORAL_TABLET | Freq: Every day | ORAL | 3 refills | Status: DC

## 2016-10-16 MED ORDER — HYDROCHLOROTHIAZIDE 25 MG PO TABS: 25.0000 mg | ORAL_TABLET | Freq: Every day | ORAL | 1 refills | Status: DC

## 2016-10-16 MED ORDER — MIRTAZAPINE 7.5 MG PO TABS: 7.5000 mg | ORAL_TABLET | Freq: Every day | ORAL | 0 refills | Status: DC

## 2016-10-16 NOTE — Patient Instructions (Signed)
Renal Artery Stenosis Renal artery stenosis (RAS) is narrowing of the artery that carries blood to your kidneys. It can affect one or both kidneys. Your kidneys filter waste and extra fluid from your blood. You get rid of the waste and fluid when you urinate. Your kidneys also make an important chemical messenger (hormone) called renin. Renin helps regulate your blood pressure. The first sign of RAS may be high blood pressure. Over time, other symptoms can develop. What are the causes? Plaque buildup in your arteries (atherosclerosis) is the main cause of RAS. The plaques that cause this are made up of:  Fat.  Cholesterol.  Calcium.  Other substances. As these substances build up in your renal artery, this slows the blood supply to your kidneys. The lack of blood and oxygen causes the signs and symptoms of RAS. A much less common cause of RAS is a disease called fibromuscular dysplasia. This disease causes abnormal cell growth that narrows the renal artery. It is not related to atherosclerosis. It occurs mostly in women who are 77-60 years old. It may be passed down through families. What increases the risk? You may be at risk for renal artery stenosis if you:  Are a man who is at least 45 years old.  Are a woman who is at least 45 years old.  Have high blood pressure.  Have high cholesterol.  Are a smoker.  Abuse alcohol.  Have diabetes or prediabetes.  Are overweight.  Have a family history of early heart disease. What are the signs or symptoms? RAS usually develops slowly. You may not have any signs or symptoms at first. The earliest signs may be:  Developing high blood pressure.  A sudden increase in existing high blood pressure.  No longer responding to medicine that used to control your blood pressure. Later signs and symptoms are due to kidney damage. They may include:  Fatigue.  Shortness of breath.  Swollen legs and feet.  Dry skin.  Headaches.  Muscle  cramps.  Loss of appetite.  Nausea or vomiting. How is this diagnosed? Your health care provider may suspect RAS based on changes in your blood pressure and your risk factors. A physical exam will be done. Your health care provider may use a stethoscope to listen for a whooshing sound (bruit) that can occur where the renal artery is blocking blood flow. Several tests may be done to confirm a diagnosis of RAS. These may include:  Blood and urine tests to check your kidney function.  Imaging tests of your kidneys, such as:  A test that involves using sound waves to create an image of your kidneys and the blood flow to your kidneys (ultrasound).  A test in which dye is injected into one of your blood vessels so images can be taken as the dye flows through your renal arteries (angiogram). These tests can be done using X-rays, a CT scan (computed tomography angiogram, CTA), or a type of MRI (magnetic resonance angiogram, MRA). How is this treated? Making lifestyle changes to reduce your risk factors is the first treatment option for early RAS. If the blood flow to one of your kidneys is cut by more than half, you may need medicine to:  Lower your blood pressure. This is the main medical treatment for RAS. You may need more than one type of medicine for this. The two types that work best for RAS are:  ACE inhibitors.  Angiotensin receptor blockers.  Reduce fluid in the body (diuretics).  Lower your cholesterol (statins). If medicine is not enough to control RAS, you may need surgery. This may involve:  Threading a tube with an inflatable balloon into the renal artery to force it open (angioplasty).  Removing plaque from inside the artery (endarterectomy). Follow these instructions at home:  Take medicines only as directed by your health care provider.  Make any lifestyle changes recommended by your health care provider. This may include:  Working with a dietitian to maintain a  heart-healthy diet. This type of diet is low in saturated fat, salt, and added sugar.  Starting an exercise program as directed by your health care provider.  Maintaining a healthy weight.  Quitting smoking.  Not abusing alcohol.  Keep all follow-up visits as directed by your health care provider. This is important. Contact a health care provider if:  Your symptoms of RAS are not getting better.  Your symptoms are changing or getting worse. Get help right away if:  You have very bad pain in your back or abdomen.  You have blood in your urine. This information is not intended to replace advice given to you by your health care provider. Make sure you discuss any questions you have with your health care provider. Document Released: 02/21/2005 Document Revised: 11/03/2015 Document Reviewed: 09/10/2013 Elsevier Interactive Patient Education  2017 [employer].

## 2016-10-16 NOTE — Progress Notes (Signed)
Subjective:  Patient ID: Laura Garrett, female    DOB: [birth year]  Age: 46 y.o. MRN: [id number]  CC: HTN  HPI ANUPAMA PIEHL is a 46 y.o. female with a PMH of HTN, CAD, Renal artery stenosis, Bipolar depression, and anxiety presents to establish care for HTN. Hypertensive since 46 yo. Pt had a US renal artery and was found to have less than 60% stenosis in 2015. Pt states she had renal intervention in 2012 but unsure of what procedure was done. Not currently taking antihypertensives. Pt states that she takes a teaspoon of vinegar and 8 oz glass of water to lower her blood pressure. Says that she can feel when her BP is up because sees small bright specks of light. Has not seen a provider or taken medications in over a year. Denies current CP, palpitations, SOB, HA, abdominal pain, f/c/n/v, rash, or GI/GU sxs.    In addition, patient would like to have a refill on Mirtazapine. She has insomnia attributed to bipolar depression. Interrupted sleep everyday,  35-45 minute intervals. Has not seen a psychiatrist in over a year. Has tried several different anti-psychotics and is wary about taking another. Thinks perhaps she was given high starting doses. Denies sxs of mania. Does not endorse suicidal/homicidal ideation or intent.    Outpatient Medications Prior to Visit  Medication Sig Dispense Refill  . amLODipine (NORVASC) 10 MG tablet Take 1 tablet (10 mg total) by mouth daily. 90 tablet 3  . simvastatin (ZOCOR) 40 MG tablet Take 1 tablet (40 mg total) by mouth at bedtime. 90 tablet 3  . clopidogrel (PLAVIX) 75 MG tablet Take 75 mg by mouth daily.    Marland Kitchen ibuprofen (ADVIL,MOTRIN) 600 MG tablet Take 600 mg by mouth every 8 (eight) hours as needed (for pain).    Marland Kitchen amoxicillin-clavulanate (AUGMENTIN) 875-125 MG tablet Take 1 tablet by mouth every 12 (twelve) hours. (Patient not taking: Reported on 10/16/2016) 14 tablet 0   No facility-administered medications prior to visit.      ROS Review of  Systems  Constitutional: Negative for chills, fever and malaise/fatigue.  Eyes: Negative for blurred vision.  Respiratory: Negative for shortness of breath.   Cardiovascular: Negative for chest pain and palpitations.  Gastrointestinal: Negative for abdominal pain and nausea.  Genitourinary: Negative for dysuria and hematuria.  Musculoskeletal: Negative for joint pain and myalgias.  Skin: Negative for rash.  Neurological: Negative for tingling and headaches.  Psychiatric/Behavioral: Negative for depression. The patient has insomnia. The patient is not nervous/anxious.     Objective:  BP (!) 204/112   Pulse 68   Temp 97.6 F (36.4 C) (Oral)   Ht 5\' 7"  (1.702 m)   Wt 134 lb 3.2 oz (60.9 kg)   LMP 10/14/2016 (Exact Date)   SpO2 99%   BMI 21.02 kg/m   BP/Weight 10/16/2016 08/27/2016 7/35/3299  Systolic BP 242 683 419  Diastolic BP 622 297 989  Wt. (Lbs) 134.2 - -  BMI 21.02 - -      Physical Exam  Constitutional: She is oriented to person, place, and time.  Well developed, well nourished, NAD, polite  HENT:  Head: Normocephalic and atraumatic.  Eyes: No scleral icterus.  Neck: Normal range of motion. Neck supple. No thyromegaly present.  Cardiovascular: Normal rate, regular rhythm and normal heart sounds.   Pulmonary/Chest: Effort normal and breath sounds normal.  Musculoskeletal: She exhibits no edema.  Neurological: She is alert and oriented to person, place, and time.  Skin: Skin is  warm and dry. No rash noted. No erythema. No pallor.  Psychiatric: She has a normal mood and affect. Her behavior is normal. Thought content normal.  Vitals reviewed.    Assessment & Plan:   1. Hypertension, unspecified type - cloNIDine (CATAPRES) tablet 0.2 mg; Take 2 tablets (0.2 mg total) by mouth once. - Begin hydrochlorothiazide (HYDRODIURIL) 25 MG tablet; Take 1 tablet (25 mg total) by mouth daily. Take on tablet in the morning.  Dispense: 90 tablet; Refill: 1 - Begin losartan  (COZAAR) 50 MG tablet; Take 1 tablet (50 mg total) by mouth daily.  Dispense: 90 tablet; Refill: 3 - CBC with Differential - Comprehensive metabolic panel - TSH - Lipid panel  2. Renal artery stenosis ([place]) - Renal artery [place] in 2015 revealed less than 60% stenosis. Unclear if there is hx of stent placement.  - VAS US RENAL ARTERY DUPLEX; Future  3. Pelvic pain - Ambulatory referral to Gynecology. Requests hysterectomy.  4. Bipolar depression ([place]) - Begin QUEtiapine (SEROQUEL) 50 MG tablet; Take 1 tablet (50 mg total) by mouth as directed. Half tab BID x2 days. Then one pill BID for two days. Then one pill TID thereafter.  Dispense: 84 tablet; Refill: 1  5. Insomnia, unspecified type - Refill mirtazapine (REMERON) 7.5 MG tablet; Take 1 tablet (7.5 mg total) by mouth at bedtime.  Dispense: 30 tablet; Refill: 0  6. Coronary artery disease with angina pectoris, unspecified vessel or lesion type, unspecified whether native or transplanted heart (HCC) - Refill nitroGLYCERIN (NITROSTAT) 0.4 MG SL tablet; Place 1 tablet (0.4 mg total) under the tongue every 5 (five) minutes as needed for chest pain.  Dispense: 50 tablet; Refill: 3   Meds ordered this encounter  Medications  . cloNIDine (CATAPRES) tablet 0.2 mg  . nitroGLYCERIN (NITROSTAT) 0.4 MG SL tablet    Sig: Place 1 tablet (0.4 mg total) under the tongue every 5 (five) minutes as needed for chest pain.    Dispense:  50 tablet    Refill:  3    Order Specific Question:   Supervising Provider    Answer:   Tresa Garter [id number]  . QUEtiapine (SEROQUEL) 50 MG tablet    Sig: Take 1 tablet (50 mg total) by mouth as directed. Half tab BID x2 days. Then one pill BID for two days. Then one pill TID thereafter.    Dispense:  84 tablet    Refill:  1    Order Specific Question:   Supervising Provider    Answer:   Tresa Garter [id number]  . mirtazapine (REMERON) 7.5 MG tablet    Sig: Take 1 tablet (7.5 mg total) by mouth at  bedtime.    Dispense:  30 tablet    Refill:  0    Order Specific Question:   Supervising Provider    Answer:   Tresa Garter [id number]  . hydrochlorothiazide (HYDRODIURIL) 25 MG tablet    Sig: Take 1 tablet (25 mg total) by mouth daily. Take on tablet in the morning.    Dispense:  90 tablet    Refill:  1    Order Specific Question:   Supervising Provider    Answer:   Tresa Garter [id number]  . losartan (COZAAR) 50 MG tablet    Sig: Take 1 tablet (50 mg total) by mouth daily.    Dispense:  90 tablet    Refill:  3    Order Specific Question:   Supervising Provider    Answer:  Tresa Garter [[id number]]    Follow-up: Return in about 4 weeks (around 11/13/2016) for HTN.   Clent Demark PA

## 2016-10-17 ENCOUNTER — Telehealth (INDEPENDENT_AMBULATORY_CARE_PROVIDER_SITE_OTHER): Payer: Self-pay | Admitting: Physician Assistant

## 2016-10-17 LAB — CBC WITH DIFFERENTIAL/PLATELET
BASOS ABS: 0.1 10*3/uL (ref 0.0–0.2)
Basos: 1 %
EOS (ABSOLUTE): 0.2 10*3/uL (ref 0.0–0.4)
EOS: 3 %
HEMATOCRIT: 39 % (ref 34.0–46.6)
HEMOGLOBIN: 12.6 g/dL (ref 11.1–15.9)
IMMATURE GRANS (ABS): 0 10*3/uL (ref 0.0–0.1)
Immature Granulocytes: 0 %
LYMPHS: 25 %
Lymphocytes Absolute: 2.2 10*3/uL (ref 0.7–3.1)
MCH: 30.7 pg (ref 26.6–33.0)
MCHC: 32.3 g/dL (ref 31.5–35.7)
MCV: 95 fL (ref 79–97)
MONOCYTES: 8 %
Monocytes Absolute: 0.7 10*3/uL (ref 0.1–0.9)
Neutrophils Absolute: 5.4 10*3/uL (ref 1.4–7.0)
Neutrophils: 63 %
PLATELETS: 286 10*3/uL (ref 150–379)
RBC: 4.1 x10E6/uL (ref 3.77–5.28)
RDW: 14.6 % (ref 12.3–15.4)
WBC: 8.6 10*3/uL (ref 3.4–10.8)

## 2016-10-17 LAB — COMPREHENSIVE METABOLIC PANEL
A/G RATIO: 1.5 (ref 1.2–2.2)
ALBUMIN: 4.3 g/dL (ref 3.5–5.5)
ALT: 12 IU/L (ref 0–32)
AST: 16 IU/L (ref 0–40)
Alkaline Phosphatase: 94 IU/L (ref 39–117)
BILIRUBIN TOTAL: 0.4 mg/dL (ref 0.0–1.2)
BUN/Creatinine Ratio: 10 (ref 9–23)
BUN: 9 mg/dL (ref 6–24)
CHLORIDE: 103 mmol/L (ref 96–106)
CO2: 24 mmol/L (ref 18–29)
Calcium: 9.1 mg/dL (ref 8.7–10.2)
Creatinine, Ser: 0.89 mg/dL (ref 0.57–1.00)
GFR calc non Af Amer: 79 mL/min/{1.73_m2} (ref >59)
GFR, EST AFRICAN AMERICAN: 91 mL/min/{1.73_m2} (ref >59)
Globulin, Total: 2.9 g/dL (ref 1.5–4.5)
Glucose: 83 mg/dL (ref 65–99)
POTASSIUM: 4.4 mmol/L (ref 3.5–5.2)
SODIUM: 141 mmol/L (ref 134–144)
TOTAL PROTEIN: 7.2 g/dL (ref 6.0–8.5)

## 2016-10-17 LAB — LIPID PANEL
CHOL/HDL RATIO: 3.6 ratio (ref 0.0–4.4)
Cholesterol, Total: 158 mg/dL (ref 100–199)
HDL: 44 mg/dL (ref >39)
LDL Calculated: 87 mg/dL (ref 0–99)
Triglycerides: 135 mg/dL (ref 0–149)
VLDL Cholesterol Cal: 27 mg/dL (ref 5–40)

## 2016-10-17 LAB — TSH: TSH: 1.55 u[IU]/mL (ref 0.450–4.500)

## 2016-10-17 NOTE — Telephone Encounter (Signed)
Per patient would like to get lab results,  Patient stated would like a call back today,

## 2016-10-17 NOTE — Telephone Encounter (Signed)
Patient called and given results. Nat Christen, CMA

## 2016-10-18 ENCOUNTER — Telehealth (INDEPENDENT_AMBULATORY_CARE_PROVIDER_SITE_OTHER): Payer: Self-pay | Admitting: Physician Assistant

## 2016-10-18 NOTE — Telephone Encounter (Signed)
Melanie with [facility] called stated to please call her back with Pre-Certification number for patient [place] appt.   Phone [phone]

## 2016-10-19 NOTE — Telephone Encounter (Signed)
Pre cert has been submitted and awaiting approval. Nat Christen, CMA

## 2016-10-23 ENCOUNTER — Ambulatory Visit (HOSPITAL_COMMUNITY): Admission: RE | Admit: 2016-10-23 | Payer: Medicaid Other | Source: Ambulatory Visit

## 2016-10-29 ENCOUNTER — Telehealth (INDEPENDENT_AMBULATORY_CARE_PROVIDER_SITE_OTHER): Payer: Self-pay | Admitting: Physician Assistant

## 2016-10-29 NOTE — Telephone Encounter (Signed)
Melanie with [facility] called back stated PA approved not for current [place] order  Please call her back to discuss order at [phone]

## 2016-10-30 ENCOUNTER — Ambulatory Visit (HOSPITAL_COMMUNITY)
Admission: RE | Admit: 2016-10-30 | Discharge: 2016-10-30 | Disposition: A | Discharge status: Home / Self Care | Payer: Medicaid Other | Source: Ambulatory Visit | Attending: Physician Assistant | Admitting: Physician Assistant

## 2016-10-30 DIAGNOSIS — I701 Atherosclerosis of renal artery: Secondary | ICD-10-CM | POA: Diagnosis present

## 2016-10-30 NOTE — Progress Notes (Addendum)
Preliminary results by tech - Renal Duplex Completed. Bilateral renal arteries are patent with mildly elevated velocities consistent with 1-59% stenosis. The proximal celiac artery demonstrated elevated velocities suggestive of > 70% stenosis. Oda Cogan, BS, RDMS, RVT

## 2016-11-01 ENCOUNTER — Other Ambulatory Visit (INDEPENDENT_AMBULATORY_CARE_PROVIDER_SITE_OTHER): Payer: Self-pay | Admitting: Physician Assistant

## 2016-11-01 DIAGNOSIS — I771 Stricture of artery: Secondary | ICD-10-CM

## 2016-11-01 DIAGNOSIS — I774 Celiac artery compression syndrome: Secondary | ICD-10-CM

## 2016-11-01 NOTE — Progress Notes (Signed)
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 Unclear if tortuosity is accounting for this elevation.

## 2016-11-09 ENCOUNTER — Other Ambulatory Visit (INDEPENDENT_AMBULATORY_CARE_PROVIDER_SITE_OTHER): Payer: Self-pay | Admitting: Physician Assistant

## 2016-11-09 DIAGNOSIS — F319 Bipolar disorder, unspecified: Secondary | ICD-10-CM

## 2016-11-09 MED ORDER — QUETIAPINE FUMARATE 50 MG PO TABS: 50.0000 mg | ORAL_TABLET | Freq: Three times a day (TID) | ORAL | 2 refills | Status: DC

## 2016-11-09 NOTE — Progress Notes (Signed)
Refill request

## 2016-11-12 ENCOUNTER — Encounter: Payer: Self-pay | Admitting: Vascular Surgery

## 2016-11-14 ENCOUNTER — Ambulatory Visit (INDEPENDENT_AMBULATORY_CARE_PROVIDER_SITE_OTHER): Payer: Medicaid Other | Admitting: Vascular Surgery

## 2016-11-14 ENCOUNTER — Encounter: Payer: Self-pay | Admitting: Vascular Surgery

## 2016-11-14 VITALS — BP 197/107 | HR 66 | Temp 97.4°F | Resp 16 | Ht 66.0 in | Wt 133.0 lb

## 2016-11-14 DIAGNOSIS — I129 Hypertensive chronic kidney disease with stage 1 through stage 4 chronic kidney disease, or unspecified chronic kidney disease: Secondary | ICD-10-CM | POA: Diagnosis not present

## 2016-11-14 NOTE — Progress Notes (Signed)
Referring Physician: Domenica Fail, PA  Patient name: Laura Garrett MRN: [id number] DOB: Nov 10, 1970 Sex: female  REASON FOR CONSULT: Celiac artery stenosis  HPI: Laura Garrett is a 46 y.o. female with long-standing poorly controlled hypertension. The patient states that her blood pressure routinely is in the 412I systolic. She states that it is rarely below this. She checks this frequently. She currently is on hydrochlorothiazide and amlodipine for blood pressure control. Her medication list also had losartan and metoprolol listed but she states that she is not taking these. She has a history of what sounds like a renal artery angioplasty in [place] many years ago. She saw my partner Dr. Donnetta Hutching several years ago to rule out renal artery stenosis and a renal artery duplex scan showed less than 60% bilaterally. 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 She states that she feels bad if her blood pressure gets below 786 systolic. He states she feels rundown and sluggish. She denies any abdominal pain. She denies any food fear. She has had no weight loss and says her weight has been consistent for many many years. Other medical problems include bipolar disorder which has been stable. She is on disability for this. She also has a history of chronic back pain with some occasional leg numbness. She is a former smoker but quit 2 months ago. She does smoke marijuana daily which she states helps her to sleep.  Past Medical History:  Diagnosis Date  . Anxiety   . CAD (coronary artery disease)    a. MI/RCA stent 04/2010 in [place].  . Chronic back pain    Associated with occasional leg numbness  . Hypertension   . Leg numbness   . Lymphadenopathy, inguinal    RLE lymphadenopathy on PV dopplers 12/2010  . Renal artery stenosis of unknown cause  ([place])    Reported bilateral renal artery stents in 2012, not seen on angiogram 12/2010   Past Surgical History:  Procedure Laterality Date  . CORONARY STENT PLACEMENT  2011  . KIDNEY STENT PLACEMENT  07/2010    Family History  Problem Relation Age of Onset  . Hypertension Mother   . Hypertension Brother   . Hypertension Paternal Grandmother   . Hyperlipidemia Paternal Grandmother   . Stroke Paternal Grandmother   . Kidney disease Paternal Grandmother   . CAD Paternal Grandmother        stent and pacemaker    SOCIAL HISTORY: Social History   Social History  . Marital status: Single    Spouse name: N/A  . Number of children: N/A  . Years of education: N/A   Occupational History  . Not on file.   Social History Main Topics  . Smoking status: Current Every Day Smoker    Packs/day: 1.00    Years: 15.00    Types: Cigarettes  . Smokeless tobacco: Never Used  . Alcohol use Yes     Comment: 1-2 beers once-twice week  . Drug use: Yes    Types: Marijuana     Comment: Daily marijuana  . Sexual activity: Not on file   Other Topics Concern  . Not on file   Social History Narrative  . No narrative on file    Allergies  Allergen Reactions  . Latex     States "I think I'm allergic to latex"  Current Outpatient Prescriptions  Medication Sig Dispense Refill  . amLODipine (NORVASC) 10 MG tablet Take 1 tablet (10 mg total) by mouth daily. 90 tablet 3  . hydrochlorothiazide (HYDRODIURIL) 25 MG tablet Take 1 tablet (25 mg total) by mouth daily. Take on tablet in the morning. 90 tablet 1  . ibuprofen (ADVIL,MOTRIN) 600 MG tablet Take 600 mg by mouth every 8 (eight) hours as needed (for pain).    Marland Kitchen losartan (COZAAR) 50 MG tablet Take 1 tablet (50 mg total) by mouth daily. 90 tablet 3  . mirtazapine (REMERON) 7.5 MG tablet Take 1 tablet (7.5 mg total) by mouth at bedtime. 30 tablet 0  . nitroGLYCERIN (NITROSTAT) 0.4 MG SL tablet Place 1 tablet (0.4 mg total) under the tongue  every 5 (five) minutes as needed for chest pain. 50 tablet 3  . nitroGLYCERIN (NITROSTAT) 0.4 MG SL tablet Place 0.4 mg under the tongue.    Marland Kitchen QUEtiapine (SEROQUEL) 50 MG tablet Take 1 tablet (50 mg total) by mouth 3 (three) times daily. One pill TID. 90 tablet 2  . clopidogrel (PLAVIX) 75 MG tablet Take 75 mg by mouth daily.    . metoprolol tartrate (LOPRESSOR) 25 MG tablet Take 12.5 mg by mouth.    . simvastatin (ZOCOR) 40 MG tablet Take 1 tablet (40 mg total) by mouth at bedtime. (Patient not taking: Reported on 11/14/2016) 90 tablet 3   No current facility-administered medications for this visit.     ROS:   General:  No weight loss, Fever, chills  HEENT: No recent headaches, no nasal bleeding, no visual changes, no sore throat  Neurologic: No dizziness, blackouts, seizures. No recent symptoms of stroke or mini- stroke. No recent episodes of slurred speech, or temporary blindness.  Cardiac: No recent episodes of chest pain/pressure, no shortness of breath at rest.  No shortness of breath with exertion.  Denies history of atrial fibrillation or irregular heartbeat  Vascular: No history of rest pain in feet.  No history of claudication.  No history of non-healing ulcer, No history of DVT   Pulmonary: No home oxygen, no productive cough, no hemoptysis,  No asthma or wheezing  Musculoskeletal:  [ ]  Arthritis, [X]  Low back pain,  [ ]  Joint pain  Hematologic:No history of hypercoagulable state.  No history of easy bleeding.  No history of anemia  Gastrointestinal: No hematochezia or melena,  No gastroesophageal reflux, no trouble swallowing  Urinary: [ ]  chronic Kidney disease, [ ]  on HD - [ ]  MWF or [ ]  TTHS, [ ]  Burning with urination, [ ]  Frequent urination, [ ]  Difficulty urinating;   Skin: No rashes  Psychological: No history of anxiety,  No history of depression   Physical Examination  Vitals:   11/14/16 0928 11/14/16 0932 11/14/16 0935  BP: (!) 193/107 (!) 212/104 (!)  197/107  Pulse: 66 66 66  Resp: 16    Temp: 97.4 F (36.3 C)    SpO2: 99%    Weight: 133 lb (60.3 kg)    Height: 5\' 6"  (1.676 m)      Body mass index is 21.47 kg/m.  General:  Alert and oriented, no acute distress HEENT: Normal Neck: No bruit or JVD Pulmonary: Clear to auscultation bilaterally Cardiac: Regular Rate and Rhythm without murmur Abdomen: Soft, non-tender, non-distended, no mass, no scars Skin: No rash Extremity Pulses:  2+ radial, brachial, femoral, dorsalis pedis, posterior tibial pulses bilaterally Musculoskeletal: No deformity or edema  Neurologic: Upper and lower extremity motor 5/5 and  symmetric  DATA:  I reviewed the patient's recent duplex ultrasound dated 10/30/2016. 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  ASSESSMENT:  Patient with poorly controlled hypertension. She has had prior ultrasounds to rule out renal artery stenosis but I believe she needs a more definitive scan since her blood pressure is so poorly controlled. As far as her celiac artery stenosis is concerned she has no symptoms related to this and this certainly could be some median arcuate ligament compression as well to explain the ultrasound findings or tortuosity   PLAN:  CT Angio abdomen and pelvis to further evaluate her mesenteric and renal vessels. The patient will follow-up with me after her CT scan. If the CT scan shows no significant stenosis she probably warrants no further workup but will certainly need further titration of her blood pressure medications to improve her control otherwise she is at very high risk of stroke myocardial events renal failure and all of this was discussed with the patient today.   Ruta Hinds, MD [facility] Office: [phone] Pager: [phone]

## 2016-11-14 NOTE — Addendum Note (Signed)
Addended by: Lianne Cure A on: 11/14/2016 02:16 PM   Modules accepted: Orders

## 2016-11-16 ENCOUNTER — Ambulatory Visit (INDEPENDENT_AMBULATORY_CARE_PROVIDER_SITE_OTHER): Payer: Medicaid Other | Admitting: Physician Assistant

## 2016-11-22 ENCOUNTER — Telehealth: Payer: Self-pay | Admitting: Vascular Surgery

## 2016-11-22 NOTE — Telephone Encounter (Signed)
Per Dr.Fields instructions, I scheduled a CTA Abd/Pelvis for 12/13/16 at 1:30pm at the [address] location of [facility]. She is to arrive at 1pm and no solid foods 4 hrs prior. And then to see Dr.Fields here at 2:45pm. The patient is aware of these appts. I also mailed her a letter with the above information. awt

## 2016-11-29 ENCOUNTER — Encounter: Payer: Self-pay | Admitting: Vascular Surgery

## 2016-12-13 ENCOUNTER — Encounter: Payer: Self-pay | Admitting: Vascular Surgery

## 2016-12-13 ENCOUNTER — Ambulatory Visit
Admission: RE | Admit: 2016-12-13 | Discharge: 2016-12-13 | Disposition: A | Discharge status: Home / Self Care | Payer: Medicaid Other | Source: Ambulatory Visit | Attending: Vascular Surgery | Admitting: Vascular Surgery

## 2016-12-13 ENCOUNTER — Ambulatory Visit (INDEPENDENT_AMBULATORY_CARE_PROVIDER_SITE_OTHER): Payer: Medicaid Other | Admitting: Vascular Surgery

## 2016-12-13 VITALS — BP 172/94 | HR 71 | Temp 98.9°F | Resp 16 | Ht 66.0 in | Wt 139.0 lb

## 2016-12-13 DIAGNOSIS — I129 Hypertensive chronic kidney disease with stage 1 through stage 4 chronic kidney disease, or unspecified chronic kidney disease: Secondary | ICD-10-CM

## 2016-12-13 DIAGNOSIS — I10 Essential (primary) hypertension: Secondary | ICD-10-CM

## 2016-12-13 MED ORDER — IOPAMIDOL (ISOVUE-370) INJECTION 76%
75.0000 mL | Freq: Once | INTRAVENOUS | Status: AC | PRN
  Administered 2016-12-13: 75 mL via INTRAVENOUS

## 2016-12-13 NOTE — Progress Notes (Signed)
History of Present Illness:  Patient is a 46 y.o. year old female with significant history of Hypertension that is poorly controlled.  who presents for repeat CTA Abdomin and Pelvis.  She has a working diagnosis of renal artery stenosis.  She has a history of what sounds like a renal artery angioplasty in [place] many years ago.  She states she was told she had renal artery stent placement.  She saw my partner Dr. Donnetta Hutching several years ago to rule out renal artery stenosis and a renal artery duplex scan showed less than 60% bilaterally. 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   She is a former smoker, manages her HTN with Norvasc and Metoprolol, CAD s/p coronary stenting now on Plavix, and Zocor for hyperlipidemia.  She is still having HTN issues with a BP today of 172/94.  Past Medical History:  Diagnosis Date  . Anxiety   . CAD (coronary artery disease)    a. MI/RCA stent 04/2010 in [place].  . Chronic back pain    Associated with occasional leg numbness  . Hypertension   . Leg numbness   . Lymphadenopathy, inguinal    RLE lymphadenopathy on PV dopplers 12/2010  . Renal artery stenosis of unknown cause ([place])    Reported bilateral renal artery stents in 2012, not seen on angiogram 12/2010    Past Surgical History:  Procedure Laterality Date  . CORONARY STENT PLACEMENT  2011  . KIDNEY STENT PLACEMENT  07/2010     Social History Social History  Substance Use Topics  . Smoking status: Former Smoker    Years: 15.00    Types: Cigarettes    Quit date: 07/16/2016  . Smokeless tobacco: Never Used  . Alcohol use Yes     Comment: 1-2 beers once-twice week    Family History Family History  Problem Relation Age of Onset  . Hypertension Mother   . Hypertension Brother   . Hypertension Paternal Grandmother   . Hyperlipidemia  Paternal Grandmother   . Stroke Paternal Grandmother   . Kidney disease Paternal Grandmother   . CAD Paternal Grandmother        stent and pacemaker    Allergies  Allergies  Allergen Reactions  . Latex     States "I think I'm allergic to latex"     Current Outpatient Prescriptions  Medication Sig Dispense Refill  . amLODipine (NORVASC) 10 MG tablet Take 1 tablet (10 mg total) by mouth daily. 90 tablet 3  . clopidogrel (PLAVIX) 75 MG tablet Take 75 mg by mouth daily.    . hydrochlorothiazide (HYDRODIURIL) 25 MG tablet Take 1 tablet (25 mg total) by mouth daily. Take on tablet in the morning. 90 tablet 1  . ibuprofen (ADVIL,MOTRIN) 600 MG tablet Take 600 mg by mouth every 8 (eight) hours as needed (for pain).    Marland Kitchen losartan (COZAAR) 50 MG tablet Take 1 tablet (50 mg total) by mouth daily. 90 tablet 3  . metoprolol tartrate (LOPRESSOR) 25 MG tablet Take 12.5 mg by mouth.    . mirtazapine (REMERON) 7.5 MG tablet Take 1 tablet (7.5 mg total) by mouth at bedtime. 30 tablet 0  . nitroGLYCERIN (NITROSTAT) 0.4 MG SL tablet Place 1 tablet (0.4 mg total) under the tongue every 5 (five) minutes as needed for chest pain. 50 tablet 3  .  nitroGLYCERIN (NITROSTAT) 0.4 MG SL tablet Place 0.4 mg under the tongue.    Marland Kitchen QUEtiapine (SEROQUEL) 50 MG tablet Take 1 tablet (50 mg total) by mouth 3 (three) times daily. One pill TID. 90 tablet 2  . simvastatin (ZOCOR) 40 MG tablet Take 1 tablet (40 mg total) by mouth at bedtime. 90 tablet 3   No current facility-administered medications for this visit.     ROS:   General:  No weight loss, Fever, chills  HEENT: No recent headaches, no nasal bleeding, no visual changes, no sore throat  Neurologic: No dizziness, blackouts, seizures. No recent symptoms of stroke or mini- stroke. No recent episodes of slurred speech, or temporary blindness.  Cardiac: No recent episodes of chest pain/pressure, no shortness of breath at rest.  No shortness of breath with  exertion.  Denies history of atrial fibrillation or irregular heartbeat  Vascular: No history of rest pain in feet.  No history of claudication.  No history of non-healing ulcer, No history of DVT   Pulmonary: No home oxygen, no productive cough, no hemoptysis,  No asthma or wheezing  Musculoskeletal:  [ ]  Arthritis, [ ]  Low back pain,  [ ]  Joint pain  Hematologic:No history of hypercoagulable state.  No history of easy bleeding.  No history of anemia  Gastrointestinal: No hematochezia or melena,  No gastroesophageal reflux, no trouble swallowing  Urinary: [ ]  chronic Kidney disease, [ ]  on HD - [ ]  MWF or [ ]  TTHS, [ ]  Burning with urination, [ ]  Frequent urination, [ ]  Difficulty urinating;   Skin: No rashes  Psychological: No history of anxiety,  No history of depression   Physical Examination  Vitals:   12/13/16 1452 12/13/16 1454  BP: (!) 158/95 (!) 172/94  Pulse: 71   Resp: 16   Temp: 98.9 F (37.2 C)   TempSrc: Oral   SpO2: 100%   Weight: 139 lb (63 kg)   Height: 5\' 6"  (1.676 m)     Body mass index is 22.44 kg/m.  General:  Alert and oriented, no acute distress HEENT: Normal Pulmonary: non labored breathing Skin: No rash on exposed skin Extremity Pulses:  2+ radial, brachial pulses bilaterally Musculoskeletal: No deformity or edema on exposed areas  Neurologic: Upper and lower extremity motor 5/5 and symmetric, ambulatory  DATA:   CTA abdomin and pelvis IMPRESSION: Renals: Bilateral renal arteries are patent without significant atherosclerotic disease at the origins. Single right and single left renal arteries identified. No inflammatory changes. No beading or string of pearls configuration identified.  No acute finding.  No significant atherosclerotic changes involving the bilateral renal arteries, with no stenosis identified by CT angiogram.  Mild atherosclerotic changes of the abdominal aorta and the iliac vessels.  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  ASSESSMENT:  Hypertension uncontrolled with history of renal artery stenosis s/p angioplasty.   PLAN: She has no significant renal artery stenosis seen on CTA.  She continues to have Hypertension that appears uncontrolled.  She states she is taking her blood pressure medication daily.  She will f/u with her primary care Physician office and Domenica Fail, PA.  F/U with our office PRN.  Theda Sers, EMMA MAUREEN PA-C [facility]  The patient was seen in conjunction with Dr. Oneida Alar  History and exam findings as above. The patient has had multiple renal artery duplex scans which showed no significant renal artery stenosis dating back to 2015. She has now had a CT Angio which  showed no significant renal artery stenosis today. I do not believe this warrants further workup. She will follow-up with [place] on as-needed basis. She will continue to have her blood pressure adjusted for her primary.  Ruta Hinds, MD [facility] Office: [phone] Pager: [phone]

## 2016-12-31 ENCOUNTER — Ambulatory Visit (INDEPENDENT_AMBULATORY_CARE_PROVIDER_SITE_OTHER): Payer: Medicaid Other | Admitting: Clinical

## 2016-12-31 ENCOUNTER — Ambulatory Visit (INDEPENDENT_AMBULATORY_CARE_PROVIDER_SITE_OTHER): Payer: Medicaid Other | Admitting: Obstetrics and Gynecology

## 2016-12-31 ENCOUNTER — Encounter: Payer: Self-pay | Admitting: Obstetrics and Gynecology

## 2016-12-31 VITALS — BP 166/98 | HR 67 | Ht 66.0 in | Wt 137.0 lb

## 2016-12-31 DIAGNOSIS — N938 Other specified abnormal uterine and vaginal bleeding: Secondary | ICD-10-CM

## 2016-12-31 DIAGNOSIS — F3130 Bipolar disorder, current episode depressed, mild or moderate severity, unspecified: Secondary | ICD-10-CM | POA: Diagnosis not present

## 2016-12-31 DIAGNOSIS — F319 Bipolar disorder, unspecified: Secondary | ICD-10-CM

## 2016-12-31 DIAGNOSIS — F3132 Bipolar disorder, current episode depressed, moderate: Secondary | ICD-10-CM

## 2016-12-31 DIAGNOSIS — R102 Pelvic and perineal pain: Secondary | ICD-10-CM | POA: Diagnosis not present

## 2016-12-31 NOTE — Patient Instructions (Signed)
Dysfunctional Uterine Bleeding °Dysfunctional uterine bleeding is abnormal bleeding from the uterus. Dysfunctional uterine bleeding includes: °· A period that comes earlier or later than usual. °· A period that is lighter, heavier, or has blood clots. °· Bleeding between periods. °· Skipping one or more periods. °· Bleeding after sexual intercourse. °· Bleeding after menopause. ° °Follow these instructions at home: °Pay attention to any changes in your symptoms. Follow these instructions to help with your condition: °Eating and drinking °· Eat well-balanced meals. Include foods that are high in iron, such as liver, meat, shellfish, green leafy vegetables, and eggs. °· If you become constipated: °? Drink plenty of water. °? Eat fruits and vegetables that are high in water and fiber, such as spinach, carrots, raspberries, apples, and mango. °Medicines °· Take over-the-counter and prescription medicines only as told by your health care provider. °· Do not change medicines without talking with your health care provider. °· Aspirin or medicines that contain aspirin may make the bleeding worse. Do not take those medicines: °? During the week before your period. °? During your period. °· If you were prescribed iron pills, take them as told by your health care provider. Iron pills help to replace iron that your body loses because of this condition. °Activity °· If you need to change your sanitary pad or tampon more than one time every 2 hours: °? Lie in bed with your feet raised (elevated). °? Place a cold pack on your lower abdomen. °? Rest as much as possible until the bleeding stops or slows down. °· Do not try to lose weight until the bleeding has stopped and your blood iron level is back to normal. °Other Instructions °· For two months, write down: °? When your period starts. °? When your period ends. °? When any abnormal bleeding occurs. °? What problems you notice. °· Keep all follow up visits as told by your health  care provider. This is important. °Contact a health care provider if: °· You get light-headed or weak. °· You have nausea and vomiting. °· You cannot eat or drink without vomiting. °· You feel dizzy or have diarrhea while you are taking medicines. °· You are taking birth control pills or hormones, and you want to change them or stop taking them. °Get help right away if: °· You develop a fever or chills. °· You need to change your sanitary pad or tampon more than one time per hour. °· Your bleeding becomes heavier, or your flow contains clots more often. °· You develop pain in your abdomen. °· You lose consciousness. °· You develop a rash. °This information is not intended to replace advice given to you by your health care provider. Make sure you discuss any questions you have with your health care provider. °Document Released: 05/25/2000 Document Revised: 11/03/2015 Document Reviewed: 08/23/2014 °Elsevier Interactive Patient Education © 2018 Elsevier Inc. ° °

## 2016-12-31 NOTE — Progress Notes (Signed)
Patient ID: Laura Garrett, female   DOB: 1971-05-27, 46 y.o.   MRN: [id number] [name] is a 46 yo G2P2002 AF who presents for eval of heavy cycles and dysmenorrhea.  Pt reports menarche at age 21. Cycles have been monthly and lasting 5-7 days until the last 6 months. Her cycles now are heavier, has to use pads and tampons, changes 5-7 times a day, more cramps and clots. She will bleed for 5-7 days and then stop for a few days and then bleed again for 2-4 days.   Sexual active without contraception. Has tired OCP's and Depo Provera in the past but did not tolerate.  H/O HTN d/t renal artery stenosis (s/p stent placement), CAD (s/p stent placement), Hypercholesterolemia, depression and bipolar disorder.  Had normal TSH and CBC this past May.  Prior smoker, smokes marijuana daily, drinks beer.  TSVD x 2 (largest 7 #)  PE AF VSS Lungs clear Heart RRR Abd soft + BS  A/P Menorrhagia        Dysmenorrhea        HTN        CAD         Depression/Bipolar  Reviewed with pt. Will check GYN U/S. F/U after U/S. To see Roselyn Reef today.

## 2016-12-31 NOTE — BH Specialist Note (Signed)
Integrated Behavioral Health Initial Visit  MRN: [id number] Name: Laura Garrett   Session Start time: 2:05 Session End time: 2:28 Total time: 20 minutes  Type of Service: [place] Interpretor:No. Interpretor Name and Language: n/a   Warm Hand Off Completed.       SUBJECTIVE: Laura Garrett is a 46 y.o. female accompanied by patient. Patient was referred by Dr Rip Harbour for bipolar depression. Patient reports the following symptoms/concerns: Pt states her primary concern today is "sleeping too much", wanting to find better housing without mold, and find out about community resources; pt used to attend group therapy, but prefers [place] medication to cope with Bipolar disorder, as "nothing else works". Pt says she takes Seroquel only when she can sleep all day the following day. Pt has experienced panic attacks in the past, not current. Duration of problem: "since I was 14"; Severity of problem: moderate  OBJECTIVE: Mood: Depressed and Affect: Appropriate Risk of harm to self or others: No plan to harm self or others   LIFE CONTEXT: Family and Social: Lives with dog in public housing; 35KK son and 65yo daughter live in [place] School/Work: SSI Self-Care: Advocating for better housing and healthcare for herself; marijuana to cope ocasionally Life Changes: Move to [place] from [place]: Patient will reduce symptoms of: anxiety and depression and increase knowledge and/or ability of: self-management skills and also: Increase healthy adjustment to current life circumstances, Increase adequate support systems for patient/family and Improve medication compliance   INTERVENTIONS: Solution-Focused Strategies, Psychoeducation and/or Health Education and Link to [employer]  Standardized Assessments completed: GAD-7 and PHQ 9  ASSESSMENT: Patient currently experiencing Bipolar affective disorder, currently  depressed, moderate. Patient may benefit from psychoeducation and brief therapeutic interventions regarding coping with symptoms of anxiety and depression related to bipolar affective disorder.  PLAN: 1. Follow up with behavioral health clinician on : As needed 2. Behavioral recommendations:  -Continue taking BH medications, as prescribed by medical provider -Consider local community resources ([place], [place] bus [place], [place] transportation) -Consider apps as additional self-coping strategy -Read educational materials regarding coping with symptoms of anxiety with panic attack and depression, along with medication compliance 3. Referral(s): Integrated Orthoptist (In Clinic) and [employer]:  Housing and Transportation 4. "From scale of 1-10, how likely are you to follow plan?": 8  Garlan Fair, LCSWA  Depression screen [facility] 2/9 12/31/2016 10/16/2016  Decreased Interest 1 1  Down, Depressed, Hopeless 3 3  PHQ - 2 Score 4 4  Altered sleeping 2 3  Tired, decreased energy 3 1  Change in appetite 1 2  Feeling bad or failure about yourself  3 2  Trouble concentrating 2 2  Moving slowly or fidgety/restless 1 1  Suicidal thoughts 0 1  PHQ-9 Score 16 16   GAD 7 : Generalized Anxiety Score 12/31/2016 10/16/2016  Nervous, Anxious, on Edge 2 1  Control/stop worrying 3 1  Worry too much - different things 3 2  Trouble relaxing 2 1  Restless 1 1  Easily annoyed or irritable 3 3  Afraid - awful might happen 0 1  Total GAD 7 Score 14 10

## 2016-12-31 NOTE — Progress Notes (Signed)
Patient verbally consented to meet with [facility] Clinician about presenting concerns. Dr Rip Harbour aware.

## 2017-01-04 ENCOUNTER — Ambulatory Visit (HOSPITAL_COMMUNITY)
Admission: RE | Admit: 2017-01-04 | Discharge: 2017-01-04 | Disposition: A | Discharge status: Home / Self Care | Payer: Medicaid Other | Source: Ambulatory Visit | Attending: Obstetrics and Gynecology | Admitting: Obstetrics and Gynecology

## 2017-01-04 DIAGNOSIS — N938 Other specified abnormal uterine and vaginal bleeding: Secondary | ICD-10-CM

## 2017-01-04 DIAGNOSIS — N83292 Other ovarian cyst, left side: Secondary | ICD-10-CM | POA: Diagnosis not present

## 2017-01-04 DIAGNOSIS — D259 Leiomyoma of uterus, unspecified: Secondary | ICD-10-CM | POA: Insufficient documentation

## 2017-01-04 DIAGNOSIS — N7011 Chronic salpingitis: Secondary | ICD-10-CM | POA: Diagnosis not present

## 2017-02-12 ENCOUNTER — Telehealth: Payer: Self-pay | Admitting: *Deleted

## 2017-02-12 ENCOUNTER — Ambulatory Visit: Payer: Self-pay | Admitting: Obstetrics and Gynecology

## 2017-02-12 NOTE — Telephone Encounter (Signed)
Laura Garrett missed her scheduled appointment to discuss [place] results. Per discussion with Dr. Rip Harbour, needs to be rescheduled. I called and left a message with Vesna she missed a scheduled appointment and needs to be rescheduled.

## 2017-12-10 ENCOUNTER — Encounter (INDEPENDENT_AMBULATORY_CARE_PROVIDER_SITE_OTHER): Payer: Self-pay | Admitting: Physician Assistant

## 2017-12-10 ENCOUNTER — Ambulatory Visit (INDEPENDENT_AMBULATORY_CARE_PROVIDER_SITE_OTHER): Payer: Medicaid Other | Admitting: Physician Assistant

## 2017-12-10 VITALS — BP 200/100 | HR 85 | Temp 98.0°F | Resp 18 | Ht 67.0 in | Wt 143.0 lb

## 2017-12-10 DIAGNOSIS — N644 Mastodynia: Secondary | ICD-10-CM

## 2017-12-10 DIAGNOSIS — Z9119 Patient's noncompliance with other medical treatment and regimen: Secondary | ICD-10-CM

## 2017-12-10 DIAGNOSIS — I10 Essential (primary) hypertension: Secondary | ICD-10-CM

## 2017-12-10 DIAGNOSIS — Z91199 Patient's noncompliance with other medical treatment and regimen due to unspecified reason: Secondary | ICD-10-CM

## 2017-12-10 LAB — POCT URINE PREGNANCY: PREG TEST UR: NEGATIVE

## 2017-12-10 MED ORDER — HYDROCHLOROTHIAZIDE 25 MG PO TABS: 25.0000 mg | ORAL_TABLET | Freq: Every day | ORAL | 1 refills | Status: DC

## 2017-12-10 MED ORDER — LOSARTAN POTASSIUM 50 MG PO TABS: 50.0000 mg | ORAL_TABLET | Freq: Every day | ORAL | 1 refills | Status: DC

## 2017-12-10 MED ORDER — SIMVASTATIN 20 MG PO TABS: 20.0000 mg | ORAL_TABLET | Freq: Every day | ORAL | 1 refills | Status: DC

## 2017-12-10 MED ORDER — METOPROLOL TARTRATE 25 MG PO TABS: 12.5000 mg | ORAL_TABLET | Freq: Two times a day (BID) | ORAL | 1 refills | Status: DC

## 2017-12-10 MED ORDER — CLONIDINE HCL 0.2 MG PO TABS
0.2000 mg | ORAL_TABLET | Freq: Once | ORAL | Status: AC
  Administered 2017-12-10: 0.2 mg via ORAL

## 2017-12-10 MED ORDER — IBUPROFEN 600 MG PO TABS: 600.0000 mg | ORAL_TABLET | Freq: Three times a day (TID) | ORAL | 0 refills | Status: DC | PRN

## 2017-12-10 MED ORDER — AMLODIPINE BESYLATE 10 MG PO TABS: 10.0000 mg | ORAL_TABLET | Freq: Every day | ORAL | 1 refills | Status: DC

## 2017-12-10 NOTE — Patient Instructions (Signed)

## 2017-12-10 NOTE — Progress Notes (Signed)
Subjective:  Patient ID: Laura Garrett, female    DOB: 11/04/1970  Age: 47 y.o. MRN: [id number]  Chief Complaint:  Bilateral breast pain.    HPI Laura Garrett is a 47 y.o. female with a PMH of HTN, CAD, Renal artery stenosis, Bipolar depression, and anxiety presents with complaint of bilateral breast pain since one month ago. Pain located laterally on each breast and described as an ache. Pain felt constantly but felt more if squeezed. Believes a gland in the breast may be swollen. Last mammogram one year ago at [facility] and was told all was normal.     BP also noted to be highly elevated. Has not taken any antihypertensive medications "six months, probably a year". Says that she can feel when her BP is up because sees small bright specks of light. Has not seen a provider or taken medications in over a year. Denies current CP, palpitations, SOB, HA, abdominal pain, f/c/n/v, rash, or GI/GU sxs.    Outpatient Medications Prior to Visit  Medication Sig Dispense Refill  . ibuprofen (ADVIL,MOTRIN) 600 MG tablet Take 600 mg by mouth every 8 (eight) hours as needed (for pain).    Marland Kitchen amLODipine (NORVASC) 10 MG tablet Take 1 tablet (10 mg total) by mouth daily. (Patient not taking: Reported on 12/10/2017) 90 tablet 3  . clopidogrel (PLAVIX) 75 MG tablet Take 75 mg by mouth daily.    . hydrochlorothiazide (HYDRODIURIL) 25 MG tablet Take 1 tablet (25 mg total) by mouth daily. Take on tablet in the morning. (Patient not taking: Reported on 12/10/2017) 90 tablet 1  . losartan (COZAAR) 50 MG tablet Take 1 tablet (50 mg total) by mouth daily. (Patient not taking: Reported on 12/10/2017) 90 tablet 3  . metoprolol tartrate (LOPRESSOR) 25 MG tablet Take 12.5 mg by mouth.    . mirtazapine (REMERON) 7.5 MG tablet Take 1 tablet (7.5 mg total) by mouth at bedtime. (Patient not taking: Reported on 12/10/2017) 30 tablet 0  . nitroGLYCERIN (NITROSTAT) 0.4 MG SL tablet Place 1 tablet (0.4 mg total) under the  tongue every 5 (five) minutes as needed for chest pain. (Patient not taking: Reported on 12/10/2017) 50 tablet 3  . nitroGLYCERIN (NITROSTAT) 0.4 MG SL tablet Place 0.4 mg under the tongue.    Marland Kitchen QUEtiapine (SEROQUEL) 50 MG tablet Take 1 tablet (50 mg total) by mouth 3 (three) times daily. One pill TID. (Patient not taking: Reported on 12/10/2017) 90 tablet 2  . simvastatin (ZOCOR) 40 MG tablet Take 1 tablet (40 mg total) by mouth at bedtime. (Patient not taking: Reported on 12/10/2017) 90 tablet 3   No facility-administered medications prior to visit.      ROS Review of Systems  Constitutional: Negative for chills, fever and malaise/fatigue.  Eyes: Negative for blurred vision.  Respiratory: Negative for shortness of breath.   Cardiovascular: Negative for chest pain and palpitations.  Gastrointestinal: Negative for abdominal pain and nausea.  Genitourinary: Negative for dysuria and hematuria.  Musculoskeletal: Negative for joint pain and myalgias.  Skin: Negative for rash.  Neurological: Negative for tingling and headaches.  Psychiatric/Behavioral: Negative for depression. The patient is not nervous/anxious.     Objective:  BP (!) 200/100 (BP Location: Left Arm, Patient Position: Sitting, Cuff Size: Normal)   Pulse 85   Temp 98 F (36.7 C) (Oral)   Resp 18   Ht 5\' 7"  (1.702 m)   Wt 143 lb (64.9 kg)   LMP 11/23/2017   SpO2 100%  BMI 22.40 kg/m   BP/Weight 12/10/2017 12/10/6376 10/16/8500  Systolic BP 774 128 786  Diastolic BP 767 98 94  Wt. (Lbs) 143 137 139  BMI 22.4 22.11 22.44      Physical Exam  Constitutional: She is oriented to person, place, and time.  Well developed, well nourished, NAD, polite  HENT:  Head: Normocephalic and atraumatic.  Eyes: No scleral icterus.  Neck: Normal range of motion. Neck supple. No thyromegaly present.  Cardiovascular: Normal rate, regular rhythm and normal heart sounds.  Pulmonary/Chest: Effort normal and breath sounds normal.   Musculoskeletal: She exhibits no edema.  Neurological: She is alert and oriented to person, place, and time.  Skin: Skin is warm and dry. No rash noted. No erythema. No pallor.  Psychiatric: She has a normal mood and affect. Her behavior is normal. Thought content normal.  Vitals reviewed.    Assessment & Plan:     1. Breast pain - MM DIGITAL SCREENING BILATERAL; Future - POCT urine pregnancy - ibuprofen (ADVIL,MOTRIN) 600 MG tablet; Take 1 tablet (600 mg total) by mouth every 8 (eight) hours as needed (for pain).  Dispense: 21 tablet; Refill: 0 - MM Digital Diagnostic Unilat R; Future - MM Digital Screening Unilat L; Future  2. Hypertension, unspecified type - Administered cloNIDine (CATAPRES) tablet 0.2 mg. - Comprehensive metabolic panel - CBC with Differential - Begin metoprolol tartrate (LOPRESSOR) 25 MG tablet; Take 0.5 tablets (12.5 mg total) by mouth 2 (two) times daily.  Dispense: 180 tablet; Refill: 1 - Begin losartan (COZAAR) 50 MG tablet; Take 1 tablet (50 mg total) by mouth daily.  Dispense: 90 tablet; Refill: 1 - Begin hydrochlorothiazide (HYDRODIURIL) 25 MG tablet; Take 1 tablet (25 mg total) by mouth daily. Take on tablet in the morning.  Dispense: 90 tablet; Refill: 1 - Begin amLODipine (NORVASC) 10 MG tablet; Take 1 tablet (10 mg total) by mouth daily.  Dispense: 90 tablet; Refill: 1 - Begin simvastatin (ZOCOR) 20 MG tablet; Take 1 tablet (20 mg total) by mouth at bedtime.  Dispense: 90 tablet; Refill: 1 - Causes of secondary hypertension will be considered once again at next visit if inadequately controlled with medications. Last renal artery US showed left artery mid segment tortuosity and less than 60% stenosis.   3. Medically noncompliant - I have clearly stated to patient that she must comply with pharmacotherapy and follow up visits to properly care for her. Furthermore, I have stated if she does not comply with medical recommendations, I will discharge her  from my care. I reiterated the importance of close follow up at this point and that she is at high risk for stroke and MI. She understood and stated she will comply.    Meds ordered this encounter  Medications  . cloNIDine (CATAPRES) tablet 0.2 mg  . metoprolol tartrate (LOPRESSOR) 25 MG tablet    Sig: Take 0.5 tablets (12.5 mg total) by mouth 2 (two) times daily.    Dispense:  180 tablet    Refill:  1    Order Specific Question:   Supervising Provider    Answer:   Charlott Rakes [[id number]]  . losartan (COZAAR) 50 MG tablet    Sig: Take 1 tablet (50 mg total) by mouth daily.    Dispense:  90 tablet    Refill:  1    Order Specific Question:   Supervising Provider    Answer:   Charlott Rakes [[id number]]  . ibuprofen (ADVIL,MOTRIN) 600 MG tablet    Sig: Take  1 tablet (600 mg total) by mouth every 8 (eight) hours as needed (for pain).    Dispense:  21 tablet    Refill:  0    Order Specific Question:   Supervising Provider    Answer:   Charlott Rakes [[id number]]  . hydrochlorothiazide (HYDRODIURIL) 25 MG tablet    Sig: Take 1 tablet (25 mg total) by mouth daily. Take on tablet in the morning.    Dispense:  90 tablet    Refill:  1    Order Specific Question:   Supervising Provider    Answer:   Charlott Rakes [[id number]]  . amLODipine (NORVASC) 10 MG tablet    Sig: Take 1 tablet (10 mg total) by mouth daily.    Dispense:  90 tablet    Refill:  1    Order Specific Question:   Supervising Provider    Answer:   Charlott Rakes [[id number]]  . simvastatin (ZOCOR) 20 MG tablet    Sig: Take 1 tablet (20 mg total) by mouth at bedtime.    Dispense:  90 tablet    Refill:  1    Order Specific Question:   Supervising Provider    Answer:   Charlott Rakes [[id number]]    Follow-up: Return in about 1 month (around 01/07/2018) for HTN.   Clent Demark PA

## 2017-12-11 ENCOUNTER — Telehealth: Payer: Self-pay

## 2017-12-11 ENCOUNTER — Other Ambulatory Visit (INDEPENDENT_AMBULATORY_CARE_PROVIDER_SITE_OTHER): Payer: Self-pay | Admitting: Physician Assistant

## 2017-12-11 DIAGNOSIS — N644 Mastodynia: Secondary | ICD-10-CM

## 2017-12-11 LAB — COMPREHENSIVE METABOLIC PANEL
ALBUMIN: 4.4 g/dL (ref 3.5–5.5)
ALT: 12 IU/L (ref 0–32)
AST: 19 IU/L (ref 0–40)
Albumin/Globulin Ratio: 1.3 (ref 1.2–2.2)
Alkaline Phosphatase: 96 IU/L (ref 39–117)
BUN / CREAT RATIO: 11 (ref 9–23)
BUN: 10 mg/dL (ref 6–24)
CO2: 24 mmol/L (ref 20–29)
CREATININE: 0.9 mg/dL (ref 0.57–1.00)
Calcium: 9.6 mg/dL (ref 8.7–10.2)
Chloride: 103 mmol/L (ref 96–106)
GFR calc non Af Amer: 77 mL/min/{1.73_m2} (ref >59)
GFR, EST AFRICAN AMERICAN: 89 mL/min/{1.73_m2} (ref >59)
GLUCOSE: 60 mg/dL — AB (ref 65–99)
Globulin, Total: 3.3 g/dL (ref 1.5–4.5)
Potassium: 4.3 mmol/L (ref 3.5–5.2)
Sodium: 141 mmol/L (ref 134–144)
TOTAL PROTEIN: 7.7 g/dL (ref 6.0–8.5)

## 2017-12-11 LAB — CBC WITH DIFFERENTIAL/PLATELET
Basophils Absolute: 0 10*3/uL (ref 0.0–0.2)
Basos: 1 %
EOS (ABSOLUTE): 0.3 10*3/uL (ref 0.0–0.4)
EOS: 4 %
HEMATOCRIT: 37.1 % (ref 34.0–46.6)
HEMOGLOBIN: 12.1 g/dL (ref 11.1–15.9)
Immature Grans (Abs): 0 10*3/uL (ref 0.0–0.1)
Immature Granulocytes: 0 %
LYMPHS ABS: 2 10*3/uL (ref 0.7–3.1)
Lymphs: 23 %
MCH: 29.9 pg (ref 26.6–33.0)
MCHC: 32.6 g/dL (ref 31.5–35.7)
MCV: 92 fL (ref 79–97)
MONOCYTES: 10 %
Monocytes Absolute: 0.9 10*3/uL (ref 0.1–0.9)
NEUTROS ABS: 5.3 10*3/uL (ref 1.4–7.0)
Neutrophils: 62 %
Platelets: 266 10*3/uL (ref 150–450)
RBC: 4.05 x10E6/uL (ref 3.77–5.28)
RDW: 14 % (ref 12.3–15.4)
WBC: 8.6 10*3/uL (ref 3.4–10.8)

## 2017-12-11 NOTE — Telephone Encounter (Signed)
-----   Message from Clent Demark, PA-C sent at 12/11/2017  8:26 AM EDT ----- Labs normal.

## 2017-12-11 NOTE — Telephone Encounter (Signed)
Left message informing patient of normal labs. Nat Christen, CMA

## 2017-12-16 ENCOUNTER — Encounter (HOSPITAL_COMMUNITY): Payer: Self-pay

## 2017-12-16 ENCOUNTER — Other Ambulatory Visit: Payer: Self-pay

## 2017-12-16 ENCOUNTER — Encounter (INDEPENDENT_AMBULATORY_CARE_PROVIDER_SITE_OTHER): Payer: Self-pay | Admitting: Physician Assistant

## 2017-12-16 ENCOUNTER — Ambulatory Visit (INDEPENDENT_AMBULATORY_CARE_PROVIDER_SITE_OTHER): Payer: Medicaid Other | Admitting: Physician Assistant

## 2017-12-16 ENCOUNTER — Telehealth (INDEPENDENT_AMBULATORY_CARE_PROVIDER_SITE_OTHER): Payer: Self-pay | Admitting: Physician Assistant

## 2017-12-16 ENCOUNTER — Emergency Department (HOSPITAL_COMMUNITY)
Admission: EM | Admit: 2017-12-16 | Discharge: 2017-12-16 | Disposition: A | Discharge status: Home / Self Care | Payer: Medicaid Other | Attending: Emergency Medicine | Admitting: Emergency Medicine

## 2017-12-16 VITALS — BP 224/116 | HR 77 | Temp 98.0°F

## 2017-12-16 DIAGNOSIS — Z5321 Procedure and treatment not carried out due to patient leaving prior to being seen by health care provider: Secondary | ICD-10-CM | POA: Diagnosis not present

## 2017-12-16 DIAGNOSIS — R079 Chest pain, unspecified: Secondary | ICD-10-CM | POA: Insufficient documentation

## 2017-12-16 LAB — I-STAT BETA HCG BLOOD, ED (MC, WL, AP ONLY): HCG, QUANTITATIVE: 12.4 m[IU]/mL — AB (ref <5)

## 2017-12-16 LAB — BASIC METABOLIC PANEL
Anion gap: 12 (ref 5–15)
BUN: 15 mg/dL (ref 6–20)
CALCIUM: 9.8 mg/dL (ref 8.9–10.3)
CO2: 24 mmol/L (ref 22–32)
CREATININE: 1.15 mg/dL — AB (ref 0.44–1.00)
Chloride: 102 mmol/L (ref 98–111)
GFR, EST NON AFRICAN AMERICAN: 57 mL/min — AB (ref >60)
Glucose, Bld: 112 mg/dL — ABNORMAL HIGH (ref 70–99)
Potassium: 3.7 mmol/L (ref 3.5–5.1)
SODIUM: 138 mmol/L (ref 135–145)

## 2017-12-16 LAB — CBC
HCT: 41.4 % (ref 36.0–46.0)
Hemoglobin: 13.2 g/dL (ref 12.0–15.0)
MCH: 29.5 pg (ref 26.0–34.0)
MCHC: 31.9 g/dL (ref 30.0–36.0)
MCV: 92.6 fL (ref 78.0–100.0)
PLATELETS: 319 10*3/uL (ref 150–400)
RBC: 4.47 MIL/uL (ref 3.87–5.11)
RDW: 13.8 % (ref 11.5–15.5)
WBC: 10.8 10*3/uL — AB (ref 4.0–10.5)

## 2017-12-16 LAB — I-STAT TROPONIN, ED: Troponin i, poc: 0 ng/mL (ref 0.00–0.08)

## 2017-12-16 MED ORDER — NITROGLYCERIN 0.4 MG SL SUBL
0.4000 mg | SUBLINGUAL_TABLET | Freq: Once | SUBLINGUAL | Status: AC
  Administered 2017-12-16: 0.4 mg via SUBLINGUAL

## 2017-12-16 MED ORDER — ASPIRIN 81 MG PO CHEW
81.0000 mg | CHEWABLE_TABLET | Freq: Once | ORAL | Status: AC
  Administered 2017-12-16: 81 mg via ORAL

## 2017-12-16 NOTE — ED Notes (Signed)
Pt is back in waiting room.

## 2017-12-16 NOTE — Progress Notes (Signed)
Subjective:  Patient ID: Laura Garrett, female    DOB: [birth year]  Age: 47 y.o. MRN: [id number]  CC: chest pain  HPI HELEM REESOR a 47 y.o.femalewith a PMH of HTN, CAD, Renal artery stenosis, Bipolar depression, and anxiety. Pt was not scheduled for an office visit today. She came to the receptionist complaining about her treatment at the emergency room earlier today and demanding her medical records. Went to the emergency room for chest pain with radiculopathy to the left arm but left AMA. Pt was advised to get her medical records from [facility] Record department. Pt eventually left and I was called because receptionist witnessed pt bent over and clutching at her chest. I proceeded outside and she told he she was having chest pain and left arm pain. I then told patient to get back into clinic and lay semi-inclined on exam table. I told the CMA to call the ambulance. Pt was then administered 0.4 mg sublingual nitroglycerin and 81 mg aspirin on my order. I spoke to patient and asked her to remain calm and trust in the medical process. I asked that she not leave AMA anymore because she may die if the underlying cause of her pain is an MI. Paramedics arrived 5-10 minutes after administration of medications. By that time, patient endorsed resolution of left arm pain and chest pain. However, she also endorsed that, "this is how it happens, in twenty minutes I will get chest pain again". Paramedics reported EKG with one PVC but no ST elevations/depressions or T wave inversions. Pt refused to go with the ambulance to any [facility] facility. Says she is done with [facility] and will be going by way of her friend's POV. Female friend present at the time of this encounter and will be taking her to [facility] for evaluation and treatment.    Outpatient Medications Prior to Visit  Medication Sig Dispense Refill  . amLODipine (NORVASC) 10 MG tablet Take 1 tablet (10 mg total) by mouth  daily. 90 tablet 1  . hydrochlorothiazide (HYDRODIURIL) 25 MG tablet Take 1 tablet (25 mg total) by mouth daily. Take on tablet in the morning. 90 tablet 1  . ibuprofen (ADVIL,MOTRIN) 600 MG tablet Take 1 tablet (600 mg total) by mouth every 8 (eight) hours as needed (for pain). 21 tablet 0  . losartan (COZAAR) 50 MG tablet Take 1 tablet (50 mg total) by mouth daily. 90 tablet 1  . metoprolol tartrate (LOPRESSOR) 25 MG tablet Take 0.5 tablets (12.5 mg total) by mouth 2 (two) times daily. 180 tablet 1  . simvastatin (ZOCOR) 20 MG tablet Take 1 tablet (20 mg total) by mouth at bedtime. 90 tablet 1   No facility-administered medications prior to visit.      ROS Review of Systems  Constitutional: Negative for chills, fever and malaise/fatigue.  Eyes: Negative for blurred vision.  Respiratory: Negative for shortness of breath.   Cardiovascular: Positive for chest pain. Negative for palpitations.  Gastrointestinal: Negative for abdominal pain and nausea.  Genitourinary: Negative for dysuria and hematuria.  Musculoskeletal: Negative for joint pain and myalgias.       Left arm pain  Skin: Negative for rash.  Neurological: Negative for tingling and headaches.  Psychiatric/Behavioral: Negative for depression. The patient is not nervous/anxious.     Objective:  BP (!) 224/116 (BP Location: Right Arm, Patient Position: Sitting, Cuff Size: Normal)   Pulse 77   Temp 98 F (36.7 C) (Oral)  LMP 11/23/2017   SpO2 98%   BP/Weight 12/16/2017 11/17/8646 09/16/2070  Systolic BP 182 883 374  Diastolic BP 451 460 479  Wt. (Lbs) - 140 143  BMI - 21.93 22.4      Physical Exam  Constitutional: She is oriented to person, place, and time.  Thin, clutching at left arm.  HENT:  Head: Normocephalic and atraumatic.  Eyes: No scleral icterus.  Neck: Normal range of motion. Neck supple. No thyromegaly present.  Cardiovascular: Normal rate, regular rhythm, normal heart sounds and intact distal pulses. Exam  reveals no gallop and no friction rub.  No murmur heard. Pulmonary/Chest: Effort normal and breath sounds normal.  Musculoskeletal: She exhibits no edema.  Neurological: She is alert and oriented to person, place, and time.  Skin: Skin is warm and dry. No rash noted. No erythema. No pallor.  Psychiatric: Her behavior is normal. Thought content normal.  Pt upset and tearful  Vitals reviewed.    Assessment & Plan:   1. Chest pain, unspecified type - See HPI. Paramedics called but patient refused to go to any [facility] facility. Pt chose to go to [facility] by friend's POV. - administered aspirin chewable tablet 81 mg - administered nitroGLYCERIN (NITROSTAT) SL tablet 0.4 mg   *Total encounter time >45 minutes  Meds ordered this encounter  Medications  . aspirin chewable tablet 81 mg  . nitroGLYCERIN (NITROSTAT) SL tablet 0.4 mg    Follow-up: No follow-ups on file.   Clent Demark PA

## 2017-12-16 NOTE — ED Notes (Signed)
Pt very anxious, explained emergency room protocol for her chest pain sx, that we need an ekg and blood work. Pt not happy about either and states "I hate this hospital, [facility], all this hospital does is lose my records and they lose my stents that I've had placed in my heart and my kidney. Can you call [facility] and see if they have open rooms" Pt again explained process. Phlebotomy in room to draw blood.

## 2017-12-16 NOTE — Telephone Encounter (Signed)
Did you give her medicaid to her? You can certainly give it to her if you didn't already. Thank you.

## 2017-12-16 NOTE — ED Notes (Signed)
Pt walked back into waiting room, asking about where her room is and staff explained that "she had a room when she left and we had to take her back out of the assigned room because she walked out". She stormed out while yelling at staff and security.  Pt was told by security that if she doesn't sit down and wait for her room and stop yelling then she can leave.  Pt continues to yell at staff while walking out. Pt comes back in door and says "you know what I need to take a picture of yall" and continues to yell and take picture of staff, then walks out.

## 2017-12-16 NOTE — ED Triage Notes (Signed)
Pt endorses left sided chest pain radiating down the left arm with shob that began this morning while sitting on her porch. Pt extremely anxious in triage. Has hx of MI's and multiple stents placed. Hypertensive.

## 2017-12-16 NOTE — Telephone Encounter (Signed)
Patient called to inform that she was beening seen at [facility] and wanted to know her medicaid number.  Thank You

## 2017-12-16 NOTE — ED Notes (Addendum)
Pt was hyperventilating in the waiting room and walking into chairs. Pt was asked to sit down by tech first, pt stated that "She just can't right now."   Pt left stating, "If I'm going to die I'm going to do it at home," while throwing her pt labels down on the counter. This tech informed pt that she had a room and would be going back any minute. Pt stated that she would wait outside then and has since gotten in her car and driven off.

## 2017-12-20 ENCOUNTER — Ambulatory Visit
Admission: RE | Admit: 2017-12-20 | Discharge: 2017-12-20 | Disposition: A | Discharge status: Home / Self Care | Payer: Medicaid Other | Source: Ambulatory Visit | Attending: Physician Assistant | Admitting: Physician Assistant

## 2017-12-20 ENCOUNTER — Telehealth (INDEPENDENT_AMBULATORY_CARE_PROVIDER_SITE_OTHER): Payer: Self-pay

## 2017-12-20 DIAGNOSIS — N644 Mastodynia: Secondary | ICD-10-CM

## 2017-12-20 NOTE — Telephone Encounter (Signed)
-----   Message from Clent Demark, PA-C sent at 12/20/2017  1:47 PM EDT ----- No evidence of malignancy. Fibrous changes to the breasts which is benign.

## 2017-12-20 NOTE — Telephone Encounter (Signed)
Patient aware of mammogram results. The breast center had already contacted her to inform her of results. Nat Christen, CMA

## 2017-12-23 ENCOUNTER — Telehealth (INDEPENDENT_AMBULATORY_CARE_PROVIDER_SITE_OTHER): Payer: Self-pay

## 2017-12-23 ENCOUNTER — Other Ambulatory Visit (INDEPENDENT_AMBULATORY_CARE_PROVIDER_SITE_OTHER): Payer: Self-pay | Admitting: Physician Assistant

## 2017-12-23 DIAGNOSIS — Z760 Encounter for issue of repeat prescription: Secondary | ICD-10-CM

## 2017-12-23 MED ORDER — NITROGLYCERIN 0.4 MG SL SUBL: 0.4000 mg | SUBLINGUAL_TABLET | SUBLINGUAL | 5 refills | Status: DC | PRN

## 2017-12-23 NOTE — Telephone Encounter (Signed)
Patient is aware that nitrostat has been sent to [facility] on [address] Nat Christen, CMA

## 2017-12-23 NOTE — Telephone Encounter (Signed)
-----   Message from Clent Demark, PA-C sent at 12/23/2017 12:21 PM EDT ----- I have sent nitroglycerine pills to [facility] on [address].    ----- Message ----- From: Nat Christen, CMA Sent: 12/20/2017   3:09 PM To: Clent Demark, PA-C  Patient is asking for refill of nitrostat. Nat Christen, CMA

## 2018-01-08 ENCOUNTER — Encounter (INDEPENDENT_AMBULATORY_CARE_PROVIDER_SITE_OTHER): Payer: Self-pay

## 2018-01-08 ENCOUNTER — Ambulatory Visit (INDEPENDENT_AMBULATORY_CARE_PROVIDER_SITE_OTHER): Payer: Medicaid Other | Admitting: Physician Assistant

## 2018-01-08 DIAGNOSIS — Z5321 Procedure and treatment not carried out due to patient leaving prior to being seen by health care provider: Secondary | ICD-10-CM

## 2018-02-24 IMAGING — US US TRANSVAGINAL NON-OB
1 series · 15 of 25 positions shown · non-contrast
Comparison: CT 12/13/2016

CLINICAL DATA: Dysfunctional uterine bleeding

EXAM:
TRANSABDOMINAL AND TRANSVAGINAL ULTRASOUND OF PELVIS
TECHNIQUE: Both transabdominal and transvaginal ultrasound examinations of the
pelvis were performed. Transabdominal technique was performed for
global imaging of the pelvis including uterus, ovaries, adnexal
regions, and pelvic cul-de-sac. It was necessary to proceed with
endovaginal exam following the transabdominal exam to visualize the
uterus, endometrium, ovaries and adnexa .

[Series 1: us transvaginal non-ob · 15 of 70 slices shown]
[im 1/70]
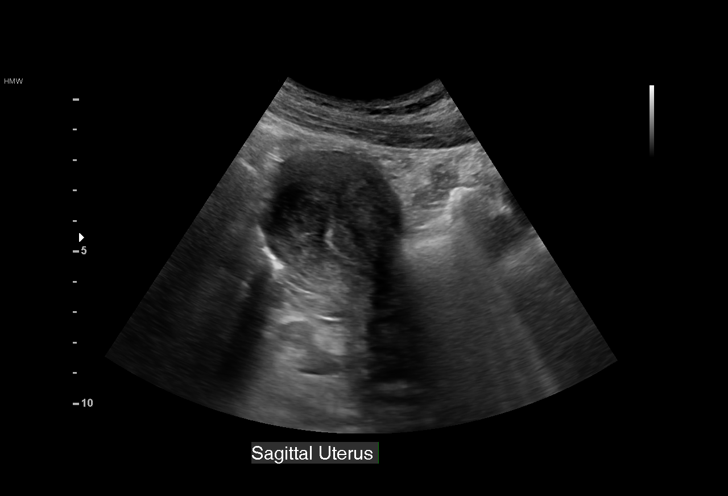
[im 6/70]
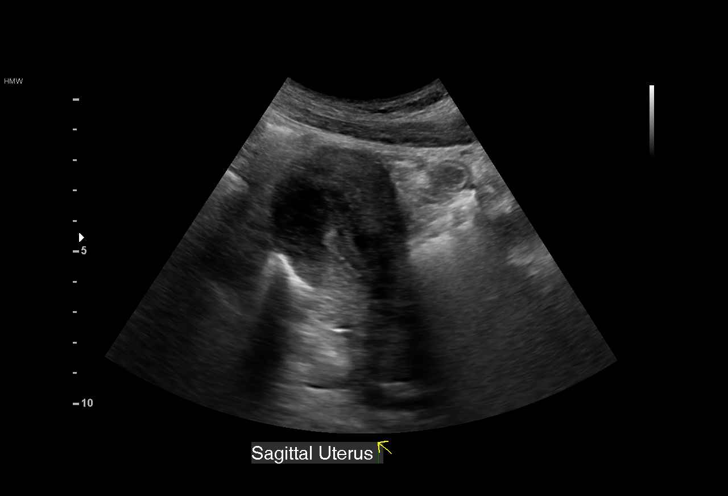
[im 12/70]
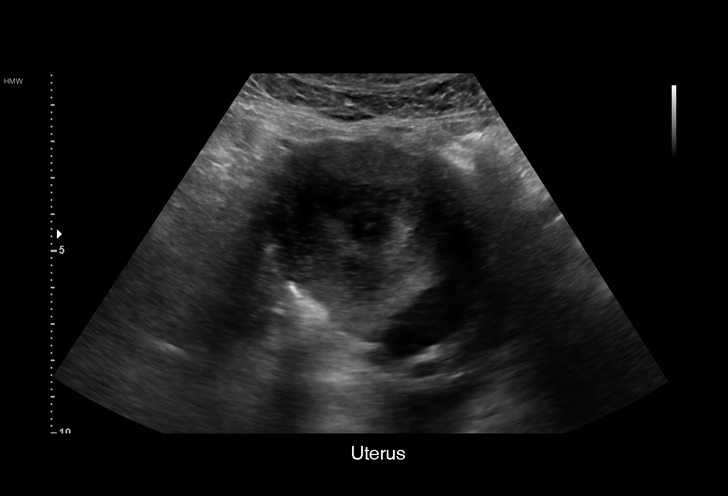
[im 15/70]
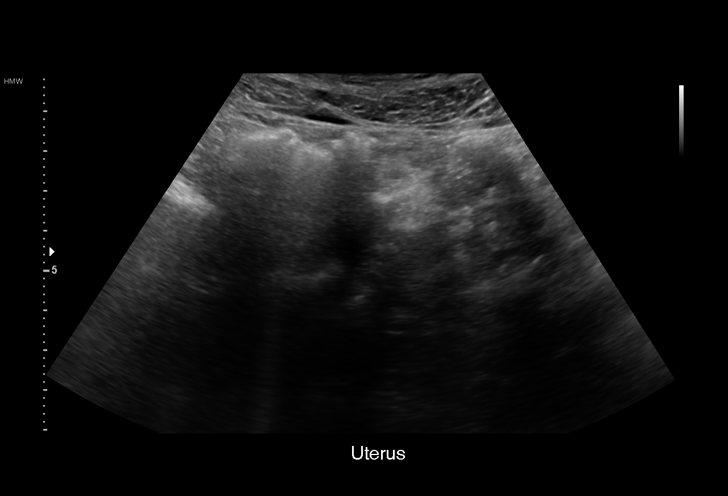
[im 21/70]
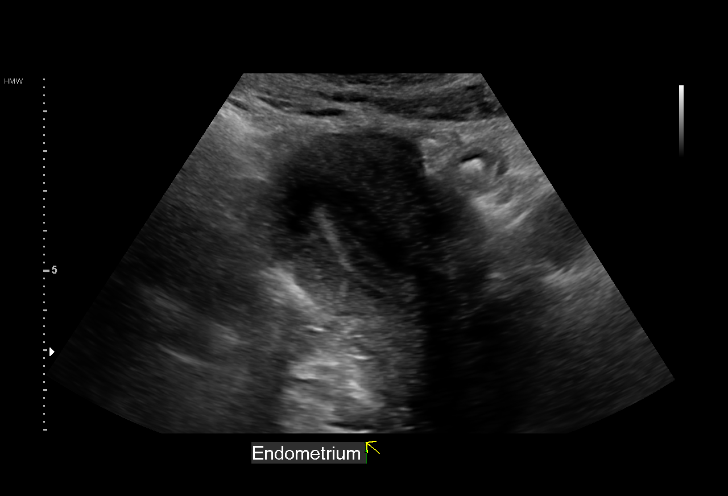
[im 26/70]
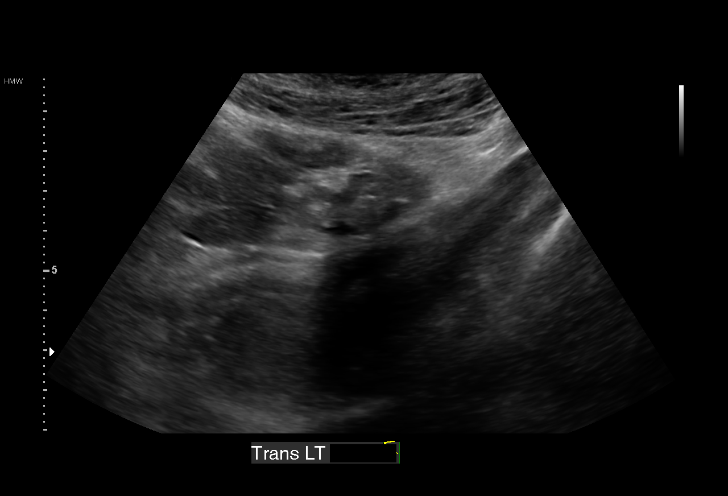
[im 29/70]
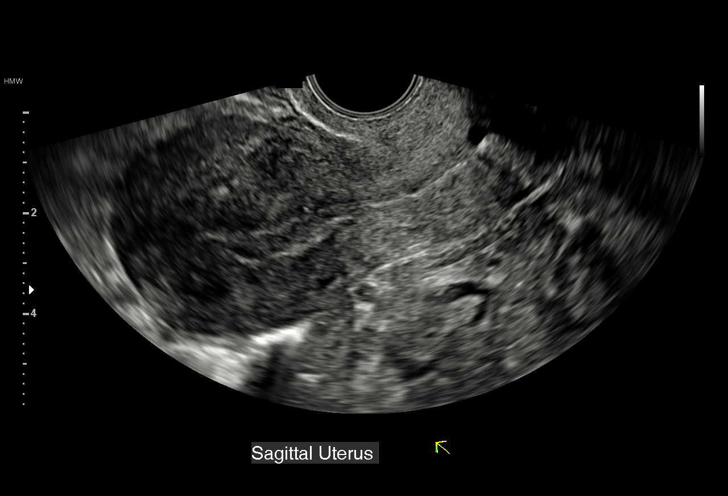
[im 35/70]
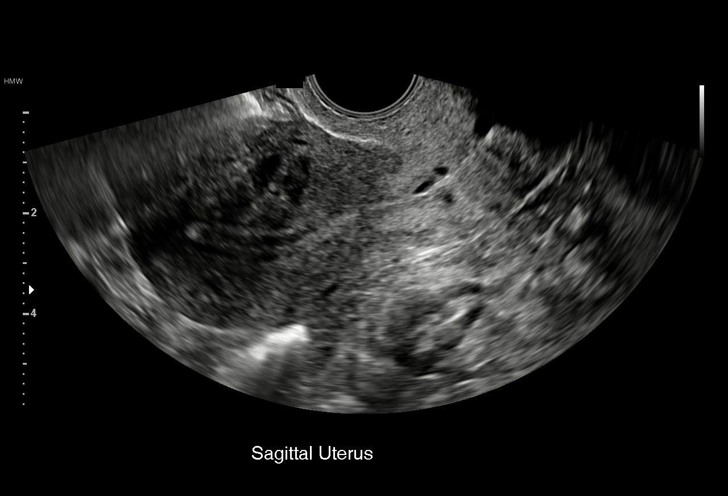
[im 41/70]
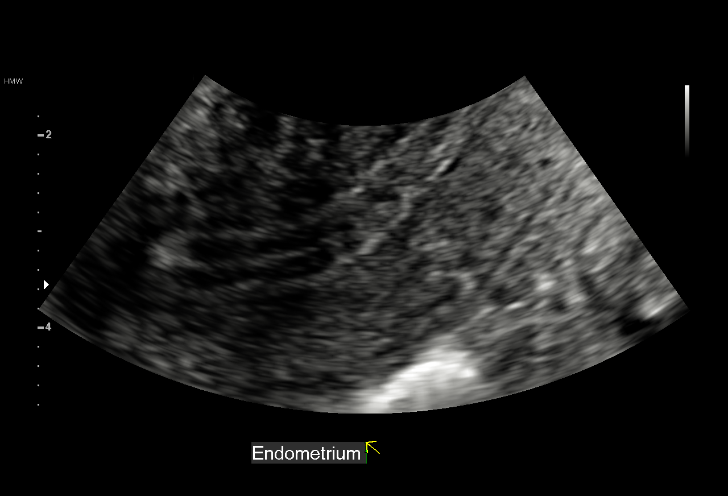
[im 44/70]
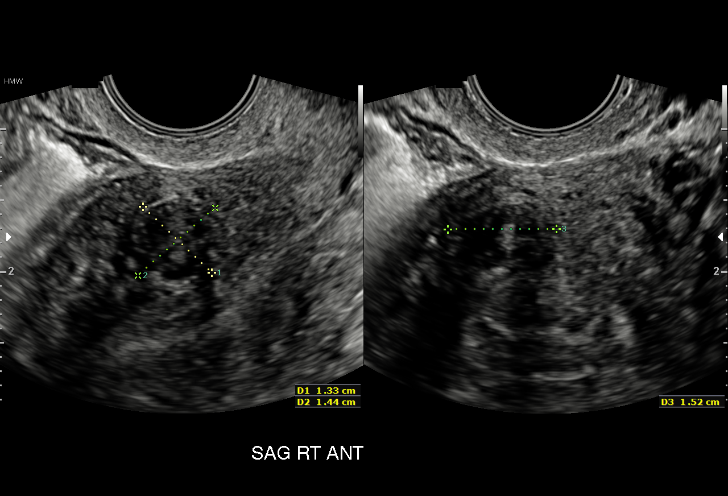
[im 49/70]
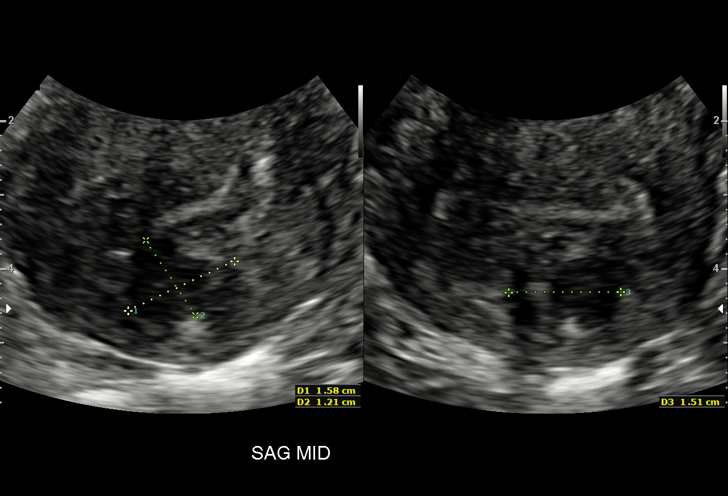
[im 55/70]
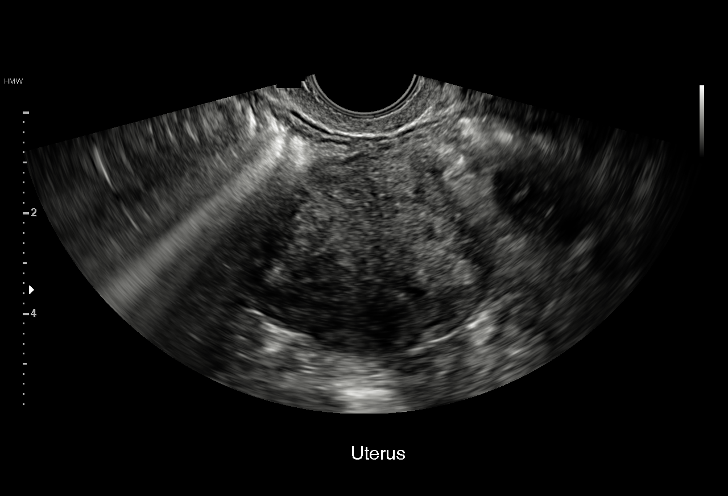
[im 58/70]
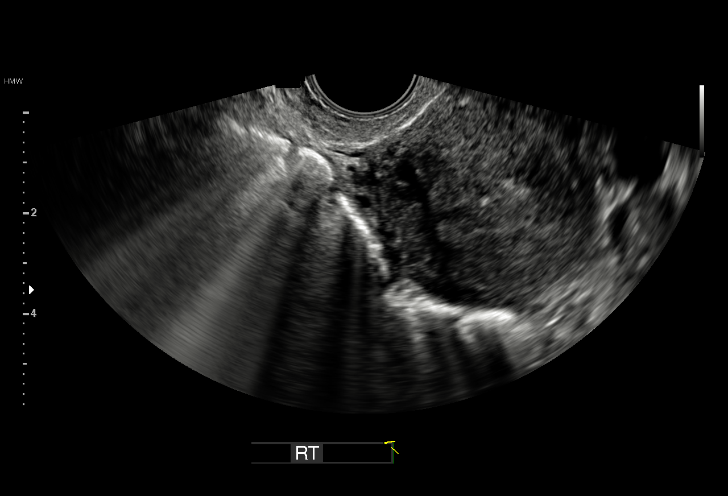
[im 64/70]
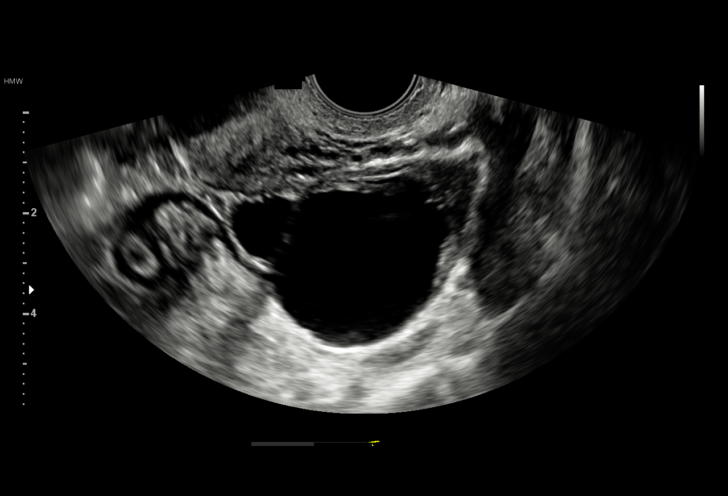
[im 70/70]
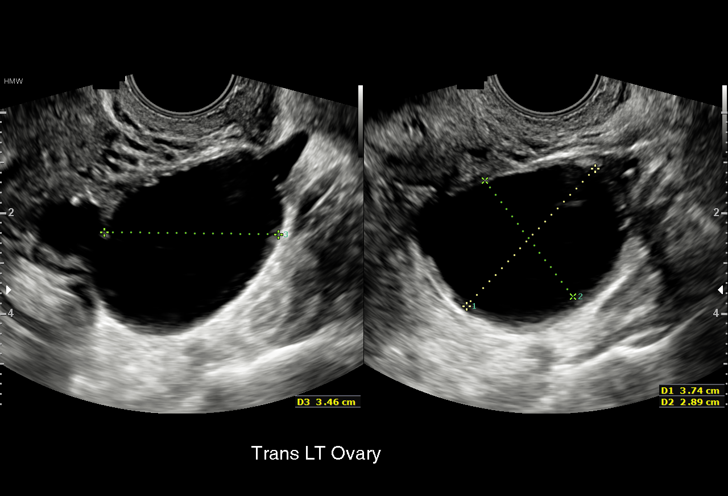

[15 of 25 positions shown; findings below may reference images not displayed]

FINDINGS: Uterus

Measurements: 9.4 x 4.4 x 4.6 cm. At least 3 small fibroids, the
largest in the posterior fundus, intramural location, measuring up
to 1.6 cm.

Endometrium

Thickness: 7 mm in thickness.  No focal abnormality visualized.

Right ovary

Measurements: Not visualized due to overlying bowel gas. No adnexal
mass seen.

Left ovary

Measurements: 4 x 8 x 3.4 x 3.4 cm. 3.7 cm simple appearing left
ovarian cyst. Dilated tubular structure in the left adnexa measures
5.0 x 2.0 x 1.7 cm compatible with hydrosalpinx

Other findings

No abnormal free fluid.
IMPRESSION: Small fibroids within the uterus, the largest 1.6 cm.

3.7 cm simple appearing left ovarian cyst.

Left hydrosalpinx.

## 2020-02-15 ENCOUNTER — Inpatient Hospital Stay (HOSPITAL_COMMUNITY): Admission: RE | Admit: 2020-02-15 | Payer: Self-pay | Source: Ambulatory Visit

## 2020-07-20 ENCOUNTER — Encounter: Payer: Self-pay | Admitting: Cardiology

## 2020-07-20 ENCOUNTER — Encounter: Payer: Self-pay | Admitting: *Deleted

## 2020-07-20 DIAGNOSIS — I700 Atherosclerosis of aorta: Secondary | ICD-10-CM | POA: Insufficient documentation

## 2020-07-20 DIAGNOSIS — F419 Anxiety disorder, unspecified: Secondary | ICD-10-CM | POA: Insufficient documentation

## 2020-07-20 DIAGNOSIS — I739 Peripheral vascular disease, unspecified: Secondary | ICD-10-CM | POA: Insufficient documentation

## 2020-07-22 ENCOUNTER — Ambulatory Visit: Payer: Medicaid Other | Admitting: Cardiology

## 2020-07-22 ENCOUNTER — Other Ambulatory Visit: Payer: Self-pay

## 2020-07-22 VITALS — BP 150/90 | HR 81 | Ht 66.0 in | Wt 141.0 lb

## 2020-07-22 DIAGNOSIS — I2510 Atherosclerotic heart disease of native coronary artery without angina pectoris: Secondary | ICD-10-CM | POA: Diagnosis not present

## 2020-07-22 DIAGNOSIS — I10 Essential (primary) hypertension: Secondary | ICD-10-CM | POA: Diagnosis not present

## 2020-07-22 DIAGNOSIS — E782 Mixed hyperlipidemia: Secondary | ICD-10-CM

## 2020-07-22 DIAGNOSIS — F17210 Nicotine dependence, cigarettes, uncomplicated: Secondary | ICD-10-CM

## 2020-07-22 MED ORDER — METOPROLOL SUCCINATE ER 50 MG PO TB24: 50.0000 mg | ORAL_TABLET | Freq: Every day | ORAL | 3 refills | Status: DC

## 2020-07-22 MED ORDER — NITROGLYCERIN 0.4 MG SL SUBL: 0.4000 mg | SUBLINGUAL_TABLET | SUBLINGUAL | 6 refills | Status: DC | PRN

## 2020-07-22 MED ORDER — ASPIRIN EC 81 MG PO TBEC: 81.0000 mg | DELAYED_RELEASE_TABLET | Freq: Every day | ORAL | 3 refills | Status: DC

## 2020-07-22 NOTE — Progress Notes (Signed)
Cardiology Office Note:    Date:  07/22/2020   ID:  Azzie Glatter, DOB [birth year], MRN [id number]  PCP:  Leonides Sake, MD  Cardiologist:  Jenean Lindau, MD   Referring MD: Leonides Sake, MD    ASSESSMENT:    1. Coronary artery disease involving native coronary artery of native heart without angina pectoris   2. Primary hypertension   3. Mixed hyperlipidemia   4. Cigarette smoker    PLAN:    In order of problems listed above:  1. Coronary artery disease: Secondary prevention stressed with the patient.  Importance of compliance with diet medication stressed and she vocalized understanding.  She is asymptomatic at this time.  I told her to walk at least half an hour a day 5 days a week and she promises to do so.  Sublingual nitroglycerin prescription was sent, its protocol and 911 protocol explained and the patient vocalized understanding questions were answered to the patient's satisfaction.  She was also advised to take a coated baby aspirin on a daily basis. 2. Essential hypertension: Blood pressure is elevated.  Diet and lifestyle modification was urged salt intake issues and diet was discussed.  I added Toprol-XL 50 mg daily hopefully this will help her cardiovascular status and also blood pressure.  She will keep a track of her blood pressures and send them to [place] in a week or 2. 3. Mixed dyslipidemia: Diet emphasized.  Lipids reviewed.  She promises to do better with diet and exercise.  She will be back in 6 weeks for follow-up appointment in a few days before that she will come for fasting blood work 4. Cigarette smoker: I spent 5 minutes with the patient discussing solely about smoking. Smoking cessation was counseled. I suggested to the patient also different medications and pharmacological interventions. Patient is keen to try stopping on its own at this time. He will get back to me if he needs any further assistance in this matter. 5. Patient will be seen in  follow-up appointment in 6 weeks or earlier if the patient has any concerns    Medication Adjustments/Labs and Tests Ordered: Current medicines are reviewed at length with the patient today.  Concerns regarding medicines are outlined above.  No orders of the defined types were placed in this encounter.  No orders of the defined types were placed in this encounter.    History of Present Illness:    Laura Garrett is a 50 y.o. female who is being seen today for the evaluation of coronary artery disease to be established with at the request of Hamrick, Lorin Mercy, MD.  Patient is a pleasant 50 year old female.  She has past medical history of coronary artery disease post stenting 6 years ago.  She has history of essential hypertension dyslipidemia.  Unfortunately she continues to smoke.  She leads a sedentary lifestyle.  She uses her elliptical at home 2-3 times a week.  With this she has no symptoms.  No chest pain orthopnea or PND.  At the time of my evaluation, the patient is alert awake oriented and in no distress.  Past Medical History:  Diagnosis Date  . Abdominal aortic atherosclerosis ([place])   . Anxiety   . Atherosclerosis of renal artery ([place]) 10/13/2013  . Bipolar depression ([place]) 10/16/2016  . CAD (coronary artery disease)    a. MI/RCA stent 04/2010 in [place].  . Chronic back pain    Associated with occasional leg numbness  . Dyshidrotic eczema   .  History of kidney stones   . History of stent insertion of renal artery 03/23/2016  . HTN (hypertension) 12/29/2010  . Hyperlipidemia 12/29/2010  . Insomnia 10/16/2016  . Leg numbness   . Lymphadenopathy, inguinal    RLE lymphadenopathy on PV dopplers 12/2010  . Myocardial infarct [facility]) 2011   Stent to RCA  . Pelvic pain 10/16/2016  . Peripheral vascular disease ([place])   . Renal artery stenosis ([place]) 12/29/2010  . Renal artery stenosis of unknown cause ([place])    Reported bilateral renal artery stents in 2012, not seen on angiogram  12/2010  . Tobacco use disorder     Past Surgical History:  Procedure Laterality Date  . CORONARY STENT PLACEMENT  2011  . KIDNEY STENT PLACEMENT Bilateral 07/2010   Renal artery stents    Current Medications: Current Meds  Medication Sig  . amLODipine-valsartan (EXFORGE) 10-320 MG tablet Take 1 tablet by mouth daily.  Marland Kitchen gabapentin (NEURONTIN) 600 MG tablet Take 600 mg by mouth 3 (three) times daily.  Marland Kitchen ibuprofen (ADVIL) 200 MG tablet Take 200 mg by mouth every 6 (six) hours as needed for mild pain.  . meloxicam (MOBIC) 15 MG tablet Take 15 mg by mouth daily.  . rosuvastatin (CRESTOR) 20 MG tablet Take 20 mg by mouth at bedtime.     Allergies:   Latex   Social History   Socioeconomic History  . Marital status: Single    Spouse name: Not on file  . Number of children: Not on file  . Years of education: Not on file  . Highest education level: Not on file  Occupational History  . Not on file  Tobacco Use  . Smoking status: Current Every Day Smoker    Years: 15.00    Types: Cigarettes    Last attempt to quit: 07/16/2016    Years since quitting: 4.0  . Smokeless tobacco: Never Used  Substance and Sexual Activity  . Alcohol use: Yes    Alcohol/week: 7.0 - 14.0 standard drinks    Types: 7 - 14 Cans of beer per week    Comment: 1-2 beers once-twice week  . Drug use: Yes    Types: Marijuana    Comment: Daily marijuana  . Sexual activity: Yes  Other Topics Concern  . Not on file  Social History Narrative  . Not on file   Social Determinants of Health   Financial Resource Strain: Not on file  Food Insecurity: Not on file  Transportation Needs: Not on file  Physical Activity: Not on file  Stress: Not on file  Social Connections: Not on file     Family History: The patient's family history includes Breast cancer in her maternal aunt and maternal grandmother; CAD in her paternal grandmother; Hyperlipidemia in her paternal grandmother; Hypertension in her brother,  father, mother, and paternal grandmother; Kidney disease in her paternal grandmother; Prostate cancer in her father; Stroke in her paternal grandmother.  ROS:   Please see the history of present illness.    All other systems reviewed and are negative.  EKGs/Labs/Other Studies Reviewed:    The following studies were reviewed today: EKG reveals sinus rhythm LVH and nonspecific ST-T changes    Recent Labs: No results found for requested labs within last 8760 hours.  Recent Lipid Panel    Component Value Date/Time   CHOL 158 10/16/2016 0921   TRIG 135 10/16/2016 0921   HDL 44 10/16/2016 0921   CHOLHDL 3.6 10/16/2016 0921   LDLCALC 87 10/16/2016  0921    Physical Exam:    VS:  BP (!) 150/90 (BP Location: Left Arm, Patient Position: Sitting)   Pulse 81   Ht 5\' 6"  (1.676 m)   Wt 141 lb (64 kg)   SpO2 92%   BMI 22.76 kg/m     Wt Readings from Last 3 Encounters:  07/22/20 141 lb (64 kg)  07/19/20 142 lb (64.4 kg)  12/16/17 140 lb (63.5 kg)     GEN: Patient is in no acute distress HEENT: Normal NECK: No JVD; No carotid bruits LYMPHATICS: No lymphadenopathy CARDIAC: S1 S2 regular, 2/6 systolic murmur at the apex. RESPIRATORY:  Clear to auscultation without rales, wheezing or rhonchi  ABDOMEN: Soft, non-tender, non-distended MUSCULOSKELETAL:  No edema; No deformity  SKIN: Warm and dry NEUROLOGIC:  Alert and oriented x 3 PSYCHIATRIC:  Normal affect    Signed, Jenean Lindau, MD  07/22/2020 3:47 PM    [facility]

## 2020-07-22 NOTE — Addendum Note (Signed)
Addended by: Truddie Hidden on: 07/22/2020 04:06 PM   Modules accepted: Orders

## 2020-07-22 NOTE — Patient Instructions (Signed)
Medication Instructions:  Your physician has recommended you make the following change in your medication:   Start taking 81 mg coated aspirin daily. Use nitroglycerin as needed for chest pain. Start Toprol XL 50 mg daily.  *If you need a refill on your cardiac medications before your next appointment, please call your pharmacy*   Lab Work: Your physician recommends that you return for lab work in: before your next visit. You need to have labs done when you are fasting.  You can come Monday through Friday 8:30 am to 12:00 pm and 1:15 to 4:30. You do not need to make an appointment as the order has already been placed. The labs you are going to have done are BMET,LFT and Lipids.  If you have labs (blood work) drawn today and your tests are completely normal, you will receive your results only by: Marland Kitchen MyChart Message (if you have MyChart) OR . A paper copy in the mail If you have any lab test that is abnormal or we need to change your treatment, we will call you to review the results.   Testing/Procedures: Your physician has requested that you have an echocardiogram. Echocardiography is a painless test that uses sound waves to create images of your heart. It provides your doctor with information about the size and shape of your heart and how well your heart's chambers and valves are working. This procedure takes approximately one hour. There are no restrictions for this procedure.     Follow-Up: At [facility], you and your health needs are our priority.  As part of our continuing mission to provide you with exceptional heart care, we have created designated Provider Care Teams.  These Care Teams include your primary Cardiologist (physician) and Advanced Practice Providers (APPs -  Physician Assistants and Nurse Practitioners) who all work together to provide you with the care you need, when you need it.  We recommend signing up for the patient portal called "MyChart".  Sign up  information is provided on this After Visit Summary.  MyChart is used to connect with patients for Virtual Visits (Telemedicine).  Patients are able to view lab/test results, encounter notes, upcoming appointments, etc.  Non-urgent messages can be sent to your provider as well.   To learn more about what you can do with MyChart, go to [email or web address].    Your next appointment:   6 week(s)  The format for your next appointment:   In Person  Provider:   Jyl Heinz, MD   Other Instructions Nitroglycerin sublingual tablets What is this medicine? NITROGLYCERIN (nye troe GLI ser in) is a type of vasodilator. It relaxes blood vessels, increasing the blood and oxygen supply to your heart. This medicine is used to relieve chest pain caused by angina. It is also used to prevent chest pain before activities like climbing stairs, going outdoors in cold weather, or sexual activity. This medicine may be used for other purposes; ask your health care provider or pharmacist if you have questions. COMMON BRAND NAME(S): Nitroquick, Nitrostat, Nitrotab What should I tell my health care provider before I take this medicine? They need to know if you have any of these conditions:  anemia  head injury, recent stroke, or bleeding in the brain  liver disease  previous heart attack  an unusual or allergic reaction to nitroglycerin, other medicines, foods, dyes, or preservatives  pregnant or trying to get pregnant  breast-feeding How should I use this medicine? Take this medicine by mouth as needed. Use at  the first sign of an angina attack (chest pain or tightness). You can also take this medicine 5 to 10 minutes before an event likely to produce chest pain. Follow the directions exactly as written on the prescription label. Place one tablet under your tongue and let it dissolve. Do not swallow whole. Replace the dose if you accidentally swallow it. It will help if your mouth is not dry. Saliva  around the tablet will help it to dissolve more quickly. Do not eat or drink, smoke or chew tobacco while a tablet is dissolving. Sit down when taking this medicine. In an angina attack, you should feel better within 5 minutes after your first dose. You can take a dose every 5 minutes up to a total of 3 doses. If you do not feel better or feel worse after 1 dose, call 9-1-1 at once. Do not take more than 3 doses in 15 minutes. Your health care provider might give you other directions. Follow those directions if he or she does. Do not take your medicine more often than directed. Talk to your health care provider about the use of this medicine in children. Special care may be needed. Overdosage: If you think you have taken too much of this medicine contact a poison control center or emergency room at once. NOTE: This medicine is only for you. Do not share this medicine with others. What if I miss a dose? This does not apply. This medicine is only used as needed. What may interact with this medicine? Do not take this medicine with any of the following medications:  certain migraine medicines like ergotamine and dihydroergotamine (DHE)  medicines used to treat erectile dysfunction like sildenafil, tadalafil, and vardenafil  riociguat This medicine may also interact with the following medications:  alteplase  aspirin  heparin  medicines for high blood pressure  medicines for mental depression  other medicines used to treat angina  phenothiazines like chlorpromazine, mesoridazine, prochlorperazine, thioridazine This list may not describe all possible interactions. Give your health care provider a list of all the medicines, herbs, non-prescription drugs, or dietary supplements you use. Also tell them if you smoke, drink alcohol, or use illegal drugs. Some items may interact with your medicine. What should I watch for while using this medicine? Tell your doctor or health care professional if  you feel your medicine is no longer working. Keep this medicine with you at all times. Sit or lie down when you take your medicine to prevent falling if you feel dizzy or faint after using it. Try to remain calm. This will help you to feel better faster. If you feel dizzy, take several deep breaths and lie down with your feet propped up, or bend forward with your head resting between your knees. You may get drowsy or dizzy. Do not drive, use machinery, or do anything that needs mental alertness until you know how this drug affects you. Do not stand or sit up quickly, especially if you are an older patient. This reduces the risk of dizzy or fainting spells. Alcohol can make you more drowsy and dizzy. Avoid alcoholic drinks. Do not treat yourself for coughs, colds, or pain while you are taking this medicine without asking your doctor or health care professional for advice. Some ingredients may increase your blood pressure. What side effects may I notice from receiving this medicine? Side effects that you should report to your doctor or health care professional as soon as possible:  allergic reactions (skin rash, itching  or hives; swelling of the face, lips, or tongue)  low blood pressure (dizziness; feeling faint or lightheaded, falls; unusually weak or tired)  low red blood cell counts (trouble breathing; feeling faint; lightheaded, falls; unusually weak or tired) Side effects that usually do not require medical attention (report to your doctor or health care professional if they continue or are bothersome):  facial flushing (redness)  headache  nausea, vomiting This list may not describe all possible side effects. Call your doctor for medical advice about side effects. You may report side effects to FDA at 1-800-FDA-1088. Where should I keep my medicine? Keep out of the reach of children. Store at room temperature between 20 and 25 degrees C (68 and 77 degrees F). Store in [facility] [employer].  Protect from light and moisture. Keep tightly closed. Throw away any unused medicine after the expiration date. NOTE: This sheet is a summary. It may not cover all possible information. If you have questions about this medicine, talk to your doctor, pharmacist, or health care provider.  2021 Elsevier/Gold Standard (2018-02-26 16:46:32)  Aspirin and Your Heart Aspirin is a medicine that prevents the platelets in your blood from sticking together. Platelets are the cells that your blood uses for clotting. Aspirin can be used to help reduce the risk of blood clots, heart attacks, and other heart-related problems. What are the risks? Daily use of aspirin can cause side effects. Some of these include:  Bleeding. Bleeding can be minor or serious. An example of minor bleeding is bleeding from a cut, and the bleeding does not stop. An example of more serious bleeding is stomach bleeding or, rarely, bleeding into the brain. Your risk of bleeding increases if you are also taking NSAIDs, such as ibuprofen.  Increased bruising.  Upset stomach.  An allergic reaction. People who have growths inside the nose (nasal polyps) have an increased risk of developing an aspirin allergy. How to use aspirin to care for your heart  Take aspirin only as told by your health care provider. Make sure that you understand how much to take and what form to take. The two forms of aspirin are: ? Non-enteric-coated.This type of aspirin does not have a coating and is absorbed quickly. This type of aspirin also comes in a chewable form. ? Enteric-coated. This type of aspirin has a coating that releases the medicine very slowly. Enteric-coated aspirin might cause less stomach upset than non-enteric-coated aspirin. This type of aspirin should not be chewed or crushed.  Work with your health care provider to find out whether it is safe and beneficial for you to take aspirin daily. Taking aspirin daily may be helpful if: ? You have  had a heart attack or chest pain, or you are at risk for a heart attack. ? You have a condition in which certain heart vessels are blocked (coronary artery disease), and you have had a procedure to treat it. Examples are:  Open-heart surgery, such as coronary artery bypass surgery (CABG).  Coronary angioplasty,which is done to widen a blood vessel of your heart.  Having a small mesh tube, or stent, placed in your coronary artery. ? You have had certain types of stroke or a mini-stroke known as a transient ischemic attack (TIA). ? You have a narrowing of the arteries that supply the limbs (peripheral artery disease, or PAD). ? You have long-term (chronic) heart rhythm problems, such as atrial fibrillation, and your health care provider thinks aspirin may help. ? You have valve disease or have  had surgery on a valve. ? You are considered at increased risk of developing coronary artery disease or PAD.   Follow these instructions at home Medicines  Take over-the-counter and prescription medicines only as told by your health care provider.  If you are taking blood thinners: ? Talk with your health care provider before you take any medicines that contain aspirin or NSAIDs, such as ibuprofen. These medicines increase your risk for dangerous bleeding. ? Take your medicine exactly as told, at the same time every day. ? Avoid activities that could cause injury or bruising, and follow instructions about how to prevent falls. ? Wear a medical alert bracelet or carry a card that lists what medicines you take. General instructions  Do not drink alcohol if: ? Your health care provider tells you not to drink. ? You are pregnant, may be pregnant, or are planning to become pregnant.  If you drink alcohol: ? Limit how much you use to:  0-1 drink a day for women.  0-2 drinks a day for men. ? Be aware of how much alcohol is in your drink. In the [place] one drink equals one 12 oz bottle of beer (355 mL),  one 5 oz glass of wine (148 mL), or one 1 oz glass of hard liquor (44 mL).  Keep all follow-up visits as told by your health care provider. This is important. Where to find more information  The American Heart Association: www.heart.org Contact a health care provider if you have:  Unusual bleeding or bruising.  Stomach pain or nausea.  Ringing in your ears.  An allergic reaction that causes hives, itchy skin, or swelling of the lips, tongue, or face. Get help right away if:  You notice that your bowel movements are bloody, or dark red or black in color.  You vomit or cough up blood.  You have blood in your urine.  You cough, breathe loudly (wheeze), or feel short of breath.  You have chest pain, especially if the pain spreads to your arms, back, neck, or jaw.  You have a headache with confusion. You have any symptoms of a stroke. "BE FAST" is an easy way to remember the main warning signs of a stroke:  B - Balance. Signs are dizziness, sudden trouble walking, or loss of balance.  E - Eyes. Signs are trouble seeing or a sudden change in vision.  F - Face. Signs are sudden weakness or numbness of the face, or the face or eyelid drooping on one side.  A - Arms. Signs are weakness or numbness in an arm. This happens suddenly and usually on one side of the body.  S - Speech. Signs are sudden trouble speaking, slurred speech, or trouble understanding what people say.  T - Time. Time to call emergency services. Write down what time symptoms started. You have other signs of a stroke, such as:  A sudden, severe headache with no known cause.  Nausea or vomiting.  Seizure. These symptoms may represent a serious problem that is an emergency. Do not wait to see if the symptoms will go away. Get medical help right away. Call your local emergency services (911 in the [place] Do not drive yourself to the hospital. Summary  Aspirin use can help reduce the risk of blood clots, heart  attacks, and other heart-related problems.  Daily use of aspirin can cause side effects.  Take aspirin only as told by your health care provider. Make sure that you understand how much to  take and what form to take.  Your health care provider will help you determine whether it is safe and beneficial for you to take aspirin daily. This information is not intended to replace advice given to you by your health care provider. Make sure you discuss any questions you have with your health care provider. Document Revised: 03/02/2019 Document Reviewed: 03/02/2019 Elsevier Patient Education  2021 [place]. Metoprolol Extended-Release Tablets What is this medicine? METOPROLOL (me TOE proe lole) is a beta blocker. It decreases the amount of work your heart has to do and helps your heart beat regularly. It treats high blood pressure and/or prevent chest pain (also called angina). It also treats heart failure. This medicine may be used for other purposes; ask your health care provider or pharmacist if you have questions. COMMON BRAND NAME(S): toprol, Toprol XL What should I tell my health care provider before I take this medicine? They need to know if you have any of these conditions:  diabetes  heart or vessel disease like slow heart rate, worsening heart failure, heart block, sick sinus syndrome or Raynaud's disease  kidney disease  liver disease  lung or breathing disease, like asthma or emphysema  pheochromocytoma  thyroid disease  an unusual or allergic reaction to metoprolol, other beta-blockers, medicines, foods, dyes, or preservatives  pregnant or trying to get pregnant  breast-feeding How should I use this medicine? Take this drug by mouth. Take it as directed on the prescription label at the same time every day. Take it with food. You may cut the tablet in half if it is scored (has a line in the middle of it). This may help you swallow the tablet if the whole tablet is too  big. Be sure to take both halves. Do not take just one-half of the tablet. Keep taking it unless your health care provider tells you to stop. Talk to your health care provider about the use of this drug in children. While it may be prescribed for children as young as 6 for selected conditions, precautions do apply. Overdosage: If you think you have taken too much of this medicine contact a poison control center or emergency room at once. NOTE: This medicine is only for you. Do not share this medicine with others. What if I miss a dose? If you miss a dose, take it as soon as you can. If it is almost time for your next dose, take only that dose. Do not take double or extra doses. What may interact with this medicine? This medicine may interact with the following medications:  certain medicines for blood pressure, heart disease, irregular heart beat  certain medicines for depression, like monoamine oxidase (MAO) inhibitors, fluoxetine, or paroxetine  clonidine  dobutamine  epinephrine  isoproterenol  reserpine This list may not describe all possible interactions. Give your health care provider a list of all the medicines, herbs, non-prescription drugs, or dietary supplements you use. Also tell them if you smoke, drink alcohol, or use illegal drugs. Some items may interact with your medicine. What should I watch for while using this medicine? Visit your doctor or health care professional for regular check ups. Contact your doctor right away if your symptoms worsen. Check your blood pressure and pulse rate regularly. Ask your health care professional what your blood pressure and pulse rate should be, and when you should contact them. You may get drowsy or dizzy. Do not drive, use machinery, or do anything that needs mental alertness until you know  how this medicine affects you. Do not sit or stand up quickly, especially if you are an older patient. This reduces the risk of dizzy or fainting  spells. Contact your doctor if these symptoms continue. Alcohol may interfere with the effect of this medicine. Avoid alcoholic drinks. This medicine may increase blood sugar. Ask your healthcare provider if changes in diet or medicines are needed if you have diabetes. What side effects may I notice from receiving this medicine? Side effects that you should report to your doctor or health care professional as soon as possible:  allergic reactions like skin rash, itching or hives  cold or numb hands or feet  depression  difficulty breathing  faint  fever with sore throat  irregular heartbeat, chest pain  rapid weight gain  signs and symptoms of high blood sugar such as being more thirsty or hungry or having to urinate more than normal. You may also feel very tired or have blurry vision.  swollen legs or ankles Side effects that usually do not require medical attention (report to your doctor or health care professional if they continue or are bothersome):  anxiety or nervousness  change in sex drive or performance  dry skin  headache  nightmares or trouble sleeping  short term memory loss  stomach upset or diarrhea This list may not describe all possible side effects. Call your doctor for medical advice about side effects. You may report side effects to FDA at 1-800-FDA-1088. Where should I keep my medicine? Keep out of the reach of children and pets. Store at room temperature between 20 and 25 degrees C (68 and 77 degrees F). Throw away any unused drug after the expiration date. NOTE: This sheet is a summary. It may not cover all possible information. If you have questions about this medicine, talk to your doctor, pharmacist, or health care provider.  2021 Elsevier/Gold Standard (2019-01-08 18:23:00)  Echocardiogram An echocardiogram is a test that uses sound waves (ultrasound) to produce images of the heart. Images from an echocardiogram can provide important  information about:  Heart size and shape.  The size and thickness and movement of your heart's walls.  Heart muscle function and strength.  Heart valve function or if you have stenosis. Stenosis is when the heart valves are too narrow.  If blood is flowing backward through the heart valves (regurgitation).  A tumor or infectious growth around the heart valves.  Areas of heart muscle that are not working well because of poor blood flow or injury from a heart attack.  Aneurysm detection. An aneurysm is a weak or damaged part of an artery wall. The wall bulges out from the normal force of blood pumping through the body. Tell a health care provider about:  Any allergies you have.  All medicines you are taking, including vitamins, herbs, eye drops, creams, and over-the-counter medicines.  Any blood disorders you have.  Any surgeries you have had.  Any medical conditions you have.  Whether you are pregnant or may be pregnant. What are the risks? Generally, this is a safe test. However, problems may occur, including an allergic reaction to dye (contrast) that may be used during the test. What happens before the test? No specific preparation is needed. You may eat and drink normally. What happens during the test?  You will take off your clothes from the waist up and put on a hospital gown.  Electrodes or electrocardiogram (ECG)patches may be placed on your chest. The electrodes or patches are  then connected to a device that monitors your heart rate and rhythm.  You will lie down on a table for an ultrasound exam. A gel will be applied to your chest to help sound waves pass through your skin.  A handheld device, called a transducer, will be pressed against your chest and moved over your heart. The transducer produces sound waves that travel to your heart and bounce back (or "echo" back) to the transducer. These sound waves will be captured in real-time and changed into images of your  heart that can be viewed on a video monitor. The images will be recorded on a computer and reviewed by your health care provider.  You may be asked to change positions or hold your breath for a short time. This makes it easier to get different views or better views of your heart.  In some cases, you may receive contrast through an IV in one of your veins. This can improve the quality of the pictures from your heart. The procedure may vary among health care providers and hospitals.   What can I expect after the test? You may return to your normal, everyday life, including diet, activities, and medicines, unless your health care provider tells you not to do that. Follow these instructions at home:  It is up to you to get the results of your test. Ask your health care provider, or the department that is doing the test, when your results will be ready.  Keep all follow-up visits. This is important. Summary  An echocardiogram is a test that uses sound waves (ultrasound) to produce images of the heart.  Images from an echocardiogram can provide important information about the size and shape of your heart, heart muscle function, heart valve function, and other possible heart problems.  You do not need to do anything to prepare before this test. You may eat and drink normally.  After the echocardiogram is completed, you may return to your normal, everyday life, unless your health care provider tells you not to do that. This information is not intended to replace advice given to you by your health care provider. Make sure you discuss any questions you have with your health care provider. Document Revised: 01/19/2020 Document Reviewed: 01/19/2020 Elsevier Patient Education  2021 [employer].

## 2020-07-25 ENCOUNTER — Telehealth: Payer: Self-pay

## 2020-07-25 NOTE — Telephone Encounter (Signed)
Left VM for pt to callback. Pt needs to be advised to take her statin and low cholesterol diet.

## 2020-08-23 ENCOUNTER — Other Ambulatory Visit: Payer: Self-pay

## 2020-08-23 ENCOUNTER — Ambulatory Visit (INDEPENDENT_AMBULATORY_CARE_PROVIDER_SITE_OTHER)

## 2020-08-23 DIAGNOSIS — I2510 Atherosclerotic heart disease of native coronary artery without angina pectoris: Secondary | ICD-10-CM | POA: Diagnosis not present

## 2020-08-23 LAB — ECHOCARDIOGRAM COMPLETE
Area-P 1/2: 3.81 cm2
P 1/2 time: 698 msec
S' Lateral: 3.1 cm

## 2020-08-23 NOTE — Progress Notes (Signed)
Complete echocardiogram performed.  Jimmy Dynasty Holquin RDCS, RVT  

## 2020-08-24 ENCOUNTER — Telehealth: Payer: Self-pay

## 2020-08-24 NOTE — Telephone Encounter (Signed)
Tried calling patient. No answer and no voicemail set up for me to leave a message. 

## 2020-08-24 NOTE — Telephone Encounter (Signed)
-----   Message from Jenean Lindau, MD sent at 08/23/2020  5:15 PM EDT ----- The results of the study is unremarkable. Please inform patient. I will discuss in detail at next appointment. [facility]c  primary care/referring physician Jenean Lindau, MD 08/23/2020 5:15 PM

## 2020-08-25 ENCOUNTER — Telehealth: Payer: Self-pay

## 2020-08-25 NOTE — Telephone Encounter (Signed)
-----   Message from Jenean Lindau, MD sent at 08/23/2020  5:15 PM EDT ----- The results of the study is unremarkable. Please inform patient. I will discuss in detail at next appointment. [facility]c  primary care/referring physician Jenean Lindau, MD 08/23/2020 5:15 PM

## 2020-08-25 NOTE — Telephone Encounter (Signed)
Tried calling patient. No answer and no voicemail set up for me to leave a message. 

## 2020-08-26 ENCOUNTER — Telehealth: Payer: Self-pay

## 2020-08-26 NOTE — Telephone Encounter (Signed)
Tried calling patient. No answer and no voicemail set up for me to leave a message.  I will mail the patient a letter at this time after trying to reach her with no success [phone].

## 2020-08-26 NOTE — Telephone Encounter (Signed)
-----   Message from Jenean Lindau, MD sent at 08/23/2020  5:15 PM EDT ----- The results of the study is unremarkable. Please inform patient. I will discuss in detail at next appointment. [facility]c  primary care/referring physician Jenean Lindau, MD 08/23/2020 5:15 PM

## 2020-09-08 ENCOUNTER — Other Ambulatory Visit: Payer: Self-pay

## 2020-09-08 DIAGNOSIS — R590 Localized enlarged lymph nodes: Secondary | ICD-10-CM | POA: Insufficient documentation

## 2020-09-08 DIAGNOSIS — Z87442 Personal history of urinary calculi: Secondary | ICD-10-CM | POA: Insufficient documentation

## 2020-09-08 DIAGNOSIS — G8929 Other chronic pain: Secondary | ICD-10-CM | POA: Insufficient documentation

## 2020-09-08 DIAGNOSIS — I701 Atherosclerosis of renal artery: Secondary | ICD-10-CM | POA: Insufficient documentation

## 2020-09-08 DIAGNOSIS — R200 Anesthesia of skin: Secondary | ICD-10-CM | POA: Insufficient documentation

## 2020-09-08 DIAGNOSIS — L301 Dyshidrosis [pompholyx]: Secondary | ICD-10-CM | POA: Insufficient documentation

## 2020-09-08 DIAGNOSIS — F17200 Nicotine dependence, unspecified, uncomplicated: Secondary | ICD-10-CM | POA: Insufficient documentation

## 2020-09-09 ENCOUNTER — Ambulatory Visit: Payer: Medicaid Other | Admitting: Cardiology

## 2020-09-14 ENCOUNTER — Ambulatory Visit: Payer: Medicaid Other | Admitting: Cardiology

## 2020-09-16 ENCOUNTER — Encounter: Payer: Self-pay | Admitting: Cardiology

## 2020-09-16 ENCOUNTER — Telehealth: Payer: Self-pay | Admitting: Licensed Clinical Social Worker

## 2020-09-16 ENCOUNTER — Other Ambulatory Visit: Payer: Self-pay

## 2020-09-16 ENCOUNTER — Ambulatory Visit (INDEPENDENT_AMBULATORY_CARE_PROVIDER_SITE_OTHER): Admitting: Cardiology

## 2020-09-16 VITALS — BP 188/98 | HR 62 | Ht 66.0 in | Wt 144.0 lb

## 2020-09-16 DIAGNOSIS — E782 Mixed hyperlipidemia: Secondary | ICD-10-CM | POA: Diagnosis not present

## 2020-09-16 DIAGNOSIS — I701 Atherosclerosis of renal artery: Secondary | ICD-10-CM

## 2020-09-16 DIAGNOSIS — I700 Atherosclerosis of aorta: Secondary | ICD-10-CM

## 2020-09-16 DIAGNOSIS — F17210 Nicotine dependence, cigarettes, uncomplicated: Secondary | ICD-10-CM | POA: Diagnosis not present

## 2020-09-16 DIAGNOSIS — I10 Essential (primary) hypertension: Secondary | ICD-10-CM

## 2020-09-16 MED ORDER — HYDROCHLOROTHIAZIDE 25 MG PO TABS: 25.0000 mg | ORAL_TABLET | Freq: Every day | ORAL | 6 refills | Status: DC

## 2020-09-16 NOTE — Telephone Encounter (Signed)
CSW referred to assist patient with obtaining a BP cuff. CSW contacted patient to inform cuff will be delivered to home. Patient grateful for support and assistance. CSW available as needed. Jackie Tucker Steedley, LCSW, CCSW-MCS [phone]  

## 2020-09-16 NOTE — Progress Notes (Signed)
Cardiology Office Note:    Date:  09/16/2020   ID:  Laura Garrett, DOB 1971/02/03, MRN [id number]  PCP:  Leonides Sake, MD  Cardiologist:  Jenean Lindau, MD   Referring MD: Leonides Sake, MD    ASSESSMENT:    1. Abdominal aortic atherosclerosis ([place])   2. Primary hypertension   3. Mixed hyperlipidemia   4. Cigarette smoker   5. Renal artery stenosis ([place])   6. Atherosclerosis of renal artery ([facility])    PLAN:    In order of problems listed above:  1. Atherosclerotic vascular disease: Patient gives history of renal stenting.  I will have a renal ultrasound done to follow-up on this.  This is in view of the fact that she has significant blood pressure issues. 2. Essential hypertension: Blood pressure is elevated.  She does not keep a track of her blood pressures at home.  We will try to send a prescription for a blood pressure machine.  Diet, lifestyle modification and salt intake issues were discussed with her at extensive length.  I told her to be very careful with salt intake.  Importance of regular exercise stressed to the best of her ability in view of her orthopedic issues. 3. Mixed dyslipidemia: Diet was emphasized and lipids were reviewed from [facility] sheet and they are fine 4. History of coronary artery disease: Details are not available to me.  She is asymptomatic at this time and on good guideline directed medical therapy. 5. Patient will be seen in follow-up appointment in 1 month or earlier if the patient has any concerns 6. In view of elevated blood pressure I prescribed hydrochlorothiazide 25 mg daily.  Potassium intake and diet was encouraged.  She will have a Chem-7 in 2 weeks.   Medication Adjustments/Labs and Tests Ordered: Current medicines are reviewed at length with the patient today.  Concerns regarding medicines are outlined above.  Orders Placed This Encounter  Procedures  . Basic metabolic panel  . VAS US RENAL ARTERY DUPLEX   Meds ordered this  encounter  Medications  . hydrochlorothiazide (HYDRODIURIL) 25 MG tablet    Sig: Take 1 tablet (25 mg total) by mouth daily.    Dispense:  30 tablet    Refill:  6     No chief complaint on file.    History of Present Illness:    Laura Garrett is a 50 y.o. female.  Patient has past medical history of essential hypertension and dyslipidemia.  She denies any problems at this time and takes care of activities of daily living.  She has orthopedic issues and is taking rehab therapy.  She also tells me that she is not compliant with salt intake issues.  She tells me that she eats a lot of salt in her diet and is not very careful with it.  She does not exercise much because of pain issues.  Past Medical History:  Diagnosis Date  . Abdominal aortic atherosclerosis ([place])   . Anxiety   . Atherosclerosis of renal artery ([place]) 10/13/2013  . Bipolar depression ([place]) 10/16/2016  . CAD (coronary artery disease)    a. MI/RCA stent 04/2010 in [place].  . Chronic back pain    Associated with occasional leg numbness  . Cigarette smoker 03/23/2016  . Dyshidrotic eczema   . History of kidney stones   . History of stent insertion of renal artery 03/23/2016  . HTN (hypertension) 12/29/2010  . Hyperlipidemia 12/29/2010  . Insomnia 10/16/2016  .  Leg numbness   . Lymphadenopathy, inguinal    RLE lymphadenopathy on PV dopplers 12/2010  . Myocardial infarct [facility]) 2011   Stent to RCA  . Pelvic pain 10/16/2016  . Peripheral vascular disease ([place])   . Renal artery stenosis ([place]) 12/29/2010  . Renal artery stenosis of unknown cause ([place])    Reported bilateral renal artery stents in 2012, not seen on angiogram 12/2010  . Tobacco use disorder     Past Surgical History:  Procedure Laterality Date  . CORONARY STENT PLACEMENT  2011  . KIDNEY STENT PLACEMENT Bilateral 07/2010   Renal artery stents    Current Medications: Current Meds  Medication Sig  . amLODipine-valsartan (EXFORGE) 10-320 MG tablet  Take 1 tablet by mouth daily.  Marland Kitchen gabapentin (NEURONTIN) 600 MG tablet Take 600 mg by mouth 3 (three) times daily.  . hydrochlorothiazide (HYDRODIURIL) 25 MG tablet Take 1 tablet (25 mg total) by mouth daily.  Marland Kitchen ibuprofen (ADVIL) 200 MG tablet Take 200 mg by mouth every 6 (six) hours as needed for mild pain.  . meloxicam (MOBIC) 15 MG tablet Take 15 mg by mouth daily.  . metoprolol succinate (TOPROL-XL) 100 MG 24 hr tablet Take 100 mg by mouth daily.  . nitroGLYCERIN (NITROSTAT) 0.4 MG SL tablet Place 0.4 mg under the tongue every 5 (five) minutes as needed for chest pain.  . rosuvastatin (CRESTOR) 20 MG tablet Take 20 mg by mouth at bedtime.     Allergies:   Latex   Social History   Socioeconomic History  . Marital status: Single    Spouse name: Not on file  . Number of children: Not on file  . Years of education: Not on file  . Highest education level: Not on file  Occupational History  . Not on file  Tobacco Use  . Smoking status: Current Every Day Smoker    Years: 15.00    Types: Cigarettes    Last attempt to quit: 07/16/2016    Years since quitting: 4.1  . Smokeless tobacco: Never Used  Substance and Sexual Activity  . Alcohol use: Yes    Alcohol/week: 7.0 - 14.0 standard drinks    Types: 7 - 14 Cans of beer per week    Comment: 1-2 beers once-twice week  . Drug use: Yes    Types: Marijuana    Comment: Daily marijuana  . Sexual activity: Yes  Other Topics Concern  . Not on file  Social History Narrative  . Not on file   Social Determinants of Health   Financial Resource Strain: Not on file  Food Insecurity: Not on file  Transportation Needs: Not on file  Physical Activity: Not on file  Stress: Not on file  Social Connections: Not on file     Family History: The patient's family history includes Breast cancer in her maternal aunt and maternal grandmother; CAD in her paternal grandmother; Hyperlipidemia in her paternal grandmother; Hypertension in her brother,  father, mother, and paternal grandmother; Kidney disease in her paternal grandmother; Prostate cancer in her father; Stroke in her paternal grandmother.  ROS:   Please see the history of present illness.    All other systems reviewed and are negative.  EKGs/Labs/Other Studies Reviewed:    The following studies were reviewed today:  IMPRESSIONS:  1. Left ventricular ejection fraction, by estimation, is 60 to 65%. The  left ventricle has normal function. The left ventricle has no regional  wall motion abnormalities. There is moderate concentric left ventricular  hypertrophy. Left ventricular  diastolic parameters are indeterminate.  2. Right ventricular systolic function is normal. The right ventricular  size is normal. There is normal pulmonary artery systolic pressure.  3. The mitral valve is normal in structure. No evidence of mitral valve  regurgitation. No evidence of mitral stenosis.  4. The aortic valve is tricuspid. Aortic valve regurgitation is mild. No  aortic stenosis is present.  5. The inferior vena cava is normal in size with greater than 50%  respiratory variability, suggesting right atrial pressure of 3 mmHg.    Recent Labs: No results found for requested labs within last 8760 hours.  Recent Lipid Panel    Component Value Date/Time   CHOL 158 10/16/2016 0921   TRIG 135 10/16/2016 0921   HDL 44 10/16/2016 0921   CHOLHDL 3.6 10/16/2016 0921   LDLCALC 87 10/16/2016 0921    Physical Exam:    VS:  BP (!) 188/98   Pulse 62   Ht 5\' 6"  (1.676 m)   Wt 144 lb (65.3 kg)   SpO2 97%   BMI 23.24 kg/m     Wt Readings from Last 3 Encounters:  09/16/20 144 lb (65.3 kg)  07/22/20 141 lb (64 kg)  07/19/20 142 lb (64.4 kg)     GEN: Patient is in no acute distress HEENT: Normal NECK: No JVD; No carotid bruits LYMPHATICS: No lymphadenopathy CARDIAC: Hear sounds regular, 2/6 systolic murmur at the apex. RESPIRATORY:  Clear to auscultation without rales,  wheezing or rhonchi  ABDOMEN: Soft, non-tender, non-distended MUSCULOSKELETAL:  No edema; No deformity  SKIN: Warm and dry NEUROLOGIC:  Alert and oriented x 3 PSYCHIATRIC:  Normal affect   Signed, Jenean Lindau, MD  09/16/2020 8:31 AM    [place]

## 2020-09-16 NOTE — Patient Instructions (Signed)
Medication Instructions:  Your physician has recommended you make the following change in your medication:   Start Hydrochlorothiazide 25 mg daily.   *If you need a refill on your cardiac medications before your next appointment, please call your pharmacy*   Lab Work: None ordered If you have labs (blood work) drawn today and your tests are completely normal, you will receive your results only by: Marland Kitchen MyChart Message (if you have MyChart) OR . A paper copy in the mail If you have any lab test that is abnormal or we need to change your treatment, we will call you to review the results.   Testing/Procedures: Your physician has requested that you have a renal artery duplex. During this test, an ultrasound is used to evaluate blood flow to the kidneys. Allow one hour for this exam. Do not eat after midnight the day before and avoid carbonated beverages. Take your medications as you usually do.      Follow-Up: At [facility], you and your health needs are our priority.  As part of our continuing mission to provide you with exceptional heart care, we have created designated Provider Care Teams.  These Care Teams include your primary Cardiologist (physician) and Advanced Practice Providers (APPs -  Physician Assistants and Nurse Practitioners) who all work together to provide you with the care you need, when you need it.  We recommend signing up for the patient portal called "MyChart".  Sign up information is provided on this After Visit Summary.  MyChart is used to connect with patients for Virtual Visits (Telemedicine).  Patients are able to view lab/test results, encounter notes, upcoming appointments, etc.  Non-urgent messages can be sent to your provider as well.   To learn more about what you can do with MyChart, go to [email or web address].    Your next appointment:   1 month(s)  The format for your next appointment:   In Person  Provider:   Jyl Heinz, MD   Other  Instructions  Blood Pressure Record Sheet To take your blood pressure, you will need a blood pressure machine. You can buy a blood pressure machine (blood pressure monitor) at your clinic, drug store, or online. When choosing one, consider:  An automatic monitor that has an arm cuff.  A cuff that wraps snugly around your upper arm. You should be able to fit only one finger between your arm and the cuff.  A device that stores blood pressure reading results.  Do not choose a monitor that measures your blood pressure from your wrist or finger. Follow your health care provider's instructions for how to take your blood pressure. To use this form:  Get one reading in the morning (a.m.) 1-2 hours after you take any medicines.  Get one reading in the evening (p.m.) before supper.  Take at least 2 readings with each blood pressure check. This makes sure the results are correct. Wait 1-2 minutes between measurements.  Write down the results in the spaces on this form.  Repeat this once a week, or as told by your health care provider.  Make a follow-up appointment with your health care provider to discuss the results. Blood pressure log Date: _______________________  a.m. _____________________(1st reading) _____________________(2nd reading)  p.m. _____________________(1st reading) _____________________(2nd reading) Date: _______________________  a.m. _____________________(1st reading) _____________________(2nd reading)  p.m. _____________________(1st reading) _____________________(2nd reading) Date: _______________________  a.m. _____________________(1st reading) _____________________(2nd reading)  p.m. _____________________(1st reading) _____________________(2nd reading) Date: _______________________  a.m. _____________________(1st reading) _____________________(2nd reading)  p.m. _____________________(1st reading) _____________________(2nd  reading) Date:  _______________________  a.m. _____________________(1st reading) _____________________(2nd reading)  p.m. _____________________(1st reading) _____________________(2nd reading) This information is not intended to replace advice given to you by your health care provider. Make sure you discuss any questions you have with your health care provider. Document Revised: 09/16/2019 Document Reviewed: 09/16/2019 Elsevier Patient Education  2021 [place].   Hydrochlorothiazide Capsules or Tablets What is this medicine? HYDROCHLOROTHIAZIDE (hye droe klor oh THYE a zide) is a diuretic. It helps you make more urine and to lose salt and excess water from your body. It treats swelling from heart, kidney, or liver disease. It also treats high blood pressure. This medicine may be used for other purposes; ask your health care provider or pharmacist if you have questions. COMMON BRAND NAME(S): Esidrix, Ezide, HydroDIURIL, Microzide, Oretic, Zide What should I tell my health care provider before I take this medicine? They need to know if you have any of these conditions:  diabetes  gout  kidney disease  liver disease  lupus  pancreatitis  an unusual or allergic reaction to hydrochlorothiazide, sulfa drugs, other medicines, foods, dyes, or preservatives  pregnant or trying to get pregnant  breast-feeding How should I use this medicine? Take this medicine by mouth. Take it as directed on the prescription label at the same time every day. You can take it with or without food. If it upsets your stomach, take it with food. Keep taking it unless your health care provider tells you to stop. Talk to your health care provider about the use of this medicine in children. While it may be prescribed for children as young as newborns for selected conditions, precautions do apply. Overdosage: If you think you have taken too much of this medicine contact a poison control center or emergency room at  once. NOTE: This medicine is only for you. Do not share this medicine with others. What if I miss a dose? If you miss a dose, take it as soon as you can. If it is almost time for your next dose, take only that dose. Do not take double or extra doses. What may interact with this medicine?  cholestyramine  colestipol  digoxin  dofetilide  lithium  medicines for blood pressure  medicines for diabetes  medicines that relax muscles for surgery  other diuretics  steroid medicines like prednisone or cortisone This list may not describe all possible interactions. Give your health care provider a list of all the medicines, herbs, non-prescription drugs, or dietary supplements you use. Also tell them if you smoke, drink alcohol, or use illegal drugs. Some items may interact with your medicine. What should I watch for while using this medicine? Visit your health care provider for regular check ups. Check your blood pressure as directed. Ask your health care provider what your blood pressure should be. Also, find out when you should contact him or her. Do not treat yourself for coughs, colds, or pain while you are using this medicine without asking your health care provider for advice. Some medicines may increase your blood pressure. You may get drowsy or dizzy. Do not drive, use machinery, or do anything that needs mental alertness until you know how this medicine affects you. Do not stand or sit up quickly, especially if you are an older patient. This reduces the risk of dizzy or fainting spells. Alcohol can make you more drowsy and dizzy. Avoid alcoholic drinks. Talk to your health care professional about your risk of skin cancer. You may be more  at risk for skin cancer if you take this medicine. This medicine can make you more sensitive to the sun. Keep out of the sun. If you cannot avoid being in the sun, wear protective clothing and use sunscreen. Do not use sun lamps or tanning  beds/booths. You may need to be on a special diet while taking this medicine. Ask your health care provider. Also, find out how many glasses of fluids you need to drink each day. Check with your health care provider if you get an attack of severe diarrhea, nausea and vomiting, or if you sweat a lot. The loss of too much body fluid can make it dangerous for you to take this medicine. This medicine may increase blood sugar. Ask your healthcare provider if changes in diet or medicines are needed if you have diabetes. What side effects may I notice from receiving this medicine? Side effects that you should report to your doctor or health care professional as soon as possible:  allergic reactions (skin rash, itching or hives; swelling of the face, lips, or tongue)  gout (severe pain, redness, or swelling in joints like the big toe)  high blood sugar (increased hunger, thirst or urination; unusually weak or tired; blurry vision)  kidney injury (trouble passing urine or change in the amount of urine)  low blood pressure (dizziness; feeling faint or lightheaded, falls; unusually weak or tired)  low potassium levels (trouble breathing; chest pain; dizziness; fast, irregular heartbeat; feeling faint or lightheaded, falls; muscle cramps or pain)  sudden change in vision or eye pain Side effects that usually do not require medical attention (report to your doctor or health care professional if they continue or are bothersome):  change in sex drive or performance  dry mouth  headache  stomach upset This list may not describe all possible side effects. Call your doctor for medical advice about side effects. You may report side effects to FDA at 1-800-FDA-1088. Where should I keep my medicine? Keep out of the reach of children and pets. Store at room temperature between 20 and 25 degrees C (68 and 77 degrees F). Protect from light and moisture. Keep the container tightly closed. Do not freeze. Get  rid of any unused medicine after the expiration date. To get rid of medicines that are no longer needed or have expired:  Take the medicine to a medicine take-back program. Check with your pharmacy or law enforcement to find a location.  If you cannot return the medicine, check the label or package insert to see if the medicine should be thrown out in the garbage or flushed down the toilet. If you are not sure, ask your health care provider. If it is safe to put in the trash, empty the medicine out of the container. Mix the medicine with cat litter, dirt, coffee grounds, or other unwanted substance. Seal the mixture in a bag or container. Put it in the trash. NOTE: This sheet is a summary. It may not cover all possible information. If you have questions about this medicine, talk to your doctor, pharmacist, or health care provider.  2021 Elsevier/[employer] Standard (2020-04-06 17:16:00)

## 2020-09-26 DIAGNOSIS — I252 Old myocardial infarction: Secondary | ICD-10-CM | POA: Insufficient documentation

## 2020-09-26 DIAGNOSIS — N939 Abnormal uterine and vaginal bleeding, unspecified: Secondary | ICD-10-CM | POA: Insufficient documentation

## 2020-09-26 DIAGNOSIS — I10 Essential (primary) hypertension: Secondary | ICD-10-CM | POA: Insufficient documentation

## 2020-09-26 HISTORY — DX: Abnormal uterine and vaginal bleeding, unspecified: N93.9

## 2020-09-26 HISTORY — DX: Old myocardial infarction: I25.2

## 2020-09-26 HISTORY — DX: Essential (primary) hypertension: I10

## 2020-09-30 ENCOUNTER — Telehealth (HOSPITAL_COMMUNITY): Payer: Self-pay | Admitting: Vascular Surgery

## 2020-09-30 ENCOUNTER — Telehealth (HOSPITAL_COMMUNITY): Payer: Self-pay | Admitting: Internal Medicine

## 2020-09-30 NOTE — Telephone Encounter (Signed)
Cecille Rubin, from [facility] faxed a stat referral on 04/18, [phone], please advise

## 2020-09-30 NOTE — Telephone Encounter (Signed)
[facility]  contacted via fax that pt is not appropriate candidate for the [facility]

## 2020-10-10 ENCOUNTER — Encounter

## 2020-11-02 ENCOUNTER — Ambulatory Visit: Admitting: Cardiology

## 2020-11-08 ENCOUNTER — Inpatient Hospital Stay (HOSPITAL_COMMUNITY)

## 2020-11-08 ENCOUNTER — Emergency Department (HOSPITAL_COMMUNITY)

## 2020-11-08 ENCOUNTER — Inpatient Hospital Stay (HOSPITAL_COMMUNITY)
Admission: EM | Admit: 2020-11-08 | Discharge: 2020-11-09 | DRG: 305 | Discharge status: Against Medical Advice | Attending: Internal Medicine | Admitting: Internal Medicine

## 2020-11-08 ENCOUNTER — Other Ambulatory Visit: Payer: Self-pay

## 2020-11-08 ENCOUNTER — Encounter (HOSPITAL_COMMUNITY): Payer: Self-pay | Admitting: *Deleted

## 2020-11-08 DIAGNOSIS — Z79899 Other long term (current) drug therapy: Secondary | ICD-10-CM

## 2020-11-08 DIAGNOSIS — I700 Atherosclerosis of aorta: Secondary | ICD-10-CM | POA: Diagnosis present

## 2020-11-08 DIAGNOSIS — Z9889 Other specified postprocedural states: Secondary | ICD-10-CM

## 2020-11-08 DIAGNOSIS — Z8249 Family history of ischemic heart disease and other diseases of the circulatory system: Secondary | ICD-10-CM

## 2020-11-08 DIAGNOSIS — F17210 Nicotine dependence, cigarettes, uncomplicated: Secondary | ICD-10-CM | POA: Diagnosis present

## 2020-11-08 DIAGNOSIS — E785 Hyperlipidemia, unspecified: Secondary | ICD-10-CM | POA: Diagnosis present

## 2020-11-08 DIAGNOSIS — F319 Bipolar disorder, unspecified: Secondary | ICD-10-CM | POA: Diagnosis not present

## 2020-11-08 DIAGNOSIS — D751 Secondary polycythemia: Secondary | ICD-10-CM | POA: Diagnosis present

## 2020-11-08 DIAGNOSIS — R079 Chest pain, unspecified: Secondary | ICD-10-CM

## 2020-11-08 DIAGNOSIS — L301 Dyshidrosis [pompholyx]: Secondary | ICD-10-CM | POA: Diagnosis present

## 2020-11-08 DIAGNOSIS — I161 Hypertensive emergency: Principal | ICD-10-CM | POA: Diagnosis present

## 2020-11-08 DIAGNOSIS — Z791 Long term (current) use of non-steroidal anti-inflammatories (NSAID): Secondary | ICD-10-CM

## 2020-11-08 DIAGNOSIS — F3130 Bipolar disorder, current episode depressed, mild or moderate severity, unspecified: Secondary | ICD-10-CM | POA: Diagnosis present

## 2020-11-08 DIAGNOSIS — D72829 Elevated white blood cell count, unspecified: Secondary | ICD-10-CM

## 2020-11-08 DIAGNOSIS — I10 Essential (primary) hypertension: Secondary | ICD-10-CM | POA: Diagnosis present

## 2020-11-08 DIAGNOSIS — I252 Old myocardial infarction: Secondary | ICD-10-CM

## 2020-11-08 DIAGNOSIS — Z8042 Family history of malignant neoplasm of prostate: Secondary | ICD-10-CM | POA: Diagnosis not present

## 2020-11-08 DIAGNOSIS — I739 Peripheral vascular disease, unspecified: Secondary | ICD-10-CM | POA: Diagnosis present

## 2020-11-08 DIAGNOSIS — I2510 Atherosclerotic heart disease of native coronary artery without angina pectoris: Secondary | ICD-10-CM | POA: Diagnosis present

## 2020-11-08 DIAGNOSIS — Z955 Presence of coronary angioplasty implant and graft: Secondary | ICD-10-CM | POA: Diagnosis not present

## 2020-11-08 DIAGNOSIS — I160 Hypertensive urgency: Secondary | ICD-10-CM | POA: Diagnosis not present

## 2020-11-08 DIAGNOSIS — R0789 Other chest pain: Secondary | ICD-10-CM

## 2020-11-08 DIAGNOSIS — Z823 Family history of stroke: Secondary | ICD-10-CM | POA: Diagnosis not present

## 2020-11-08 DIAGNOSIS — M549 Dorsalgia, unspecified: Secondary | ICD-10-CM | POA: Diagnosis present

## 2020-11-08 DIAGNOSIS — Z91040 Latex allergy status: Secondary | ICD-10-CM | POA: Diagnosis not present

## 2020-11-08 DIAGNOSIS — Z83438 Family history of other disorder of lipoprotein metabolism and other lipidemia: Secondary | ICD-10-CM

## 2020-11-08 DIAGNOSIS — I701 Atherosclerosis of renal artery: Secondary | ICD-10-CM | POA: Diagnosis present

## 2020-11-08 DIAGNOSIS — R739 Hyperglycemia, unspecified: Secondary | ICD-10-CM | POA: Diagnosis not present

## 2020-11-08 DIAGNOSIS — E782 Mixed hyperlipidemia: Secondary | ICD-10-CM | POA: Diagnosis not present

## 2020-11-08 DIAGNOSIS — G8929 Other chronic pain: Secondary | ICD-10-CM | POA: Diagnosis present

## 2020-11-08 DIAGNOSIS — Z5329 Procedure and treatment not carried out because of patient's decision for other reasons: Secondary | ICD-10-CM | POA: Diagnosis present

## 2020-11-08 DIAGNOSIS — F419 Anxiety disorder, unspecified: Secondary | ICD-10-CM | POA: Diagnosis present

## 2020-11-08 DIAGNOSIS — Z803 Family history of malignant neoplasm of breast: Secondary | ICD-10-CM

## 2020-11-08 HISTORY — DX: Elevated white blood cell count, unspecified: D72.829

## 2020-11-08 HISTORY — DX: Hypertensive emergency: I16.1

## 2020-11-08 LAB — CBC
HCT: 47.9 % — ABNORMAL HIGH (ref 36.0–46.0)
Hemoglobin: 16 g/dL — ABNORMAL HIGH (ref 12.0–15.0)
MCH: 31.7 pg (ref 26.0–34.0)
MCHC: 33.4 g/dL (ref 30.0–36.0)
MCV: 94.9 fL (ref 80.0–100.0)
Platelets: 252 10*3/uL (ref 150–400)
RBC: 5.05 MIL/uL (ref 3.87–5.11)
RDW: 13.6 % (ref 11.5–15.5)
WBC: 12 10*3/uL — ABNORMAL HIGH (ref 4.0–10.5)
nRBC: 0 % (ref 0.0–0.2)

## 2020-11-08 LAB — BASIC METABOLIC PANEL
Anion gap: 11 (ref 5–15)
BUN: 12 mg/dL (ref 6–20)
CO2: 28 mmol/L (ref 22–32)
Calcium: 9.9 mg/dL (ref 8.9–10.3)
Chloride: 99 mmol/L (ref 98–111)
Creatinine, Ser: 0.91 mg/dL (ref 0.44–1.00)
GFR, Estimated: >60 mL/min (ref >60)
Glucose, Bld: 106 mg/dL — ABNORMAL HIGH (ref 70–99)
Potassium: 3.9 mmol/L (ref 3.5–5.1)
Sodium: 138 mmol/L (ref 135–145)

## 2020-11-08 LAB — ECHOCARDIOGRAM COMPLETE
Area-P 1/2: 3.12 cm2
Height: 66 in
P 1/2 time: 860 msec
S' Lateral: 2.35 cm
Weight: 2240 oz

## 2020-11-08 LAB — MRSA PCR SCREENING: MRSA by PCR: NEGATIVE

## 2020-11-08 LAB — I-STAT BETA HCG BLOOD, ED (MC, WL, AP ONLY): I-stat hCG, quantitative: 8.5 m[IU]/mL — ABNORMAL HIGH (ref <5)

## 2020-11-08 LAB — TROPONIN I (HIGH SENSITIVITY)
Troponin I (High Sensitivity): 4 ng/L (ref <18)
Troponin I (High Sensitivity): 4 ng/L (ref <18)

## 2020-11-08 LAB — D-DIMER, QUANTITATIVE: D-Dimer, Quant: 0.77 ug/mL-FEU — ABNORMAL HIGH (ref 0.00–0.50)

## 2020-11-08 LAB — MAGNESIUM: Magnesium: 2.2 mg/dL (ref 1.7–2.4)

## 2020-11-08 MED ORDER — CHLORHEXIDINE GLUCONATE CLOTH 2 % EX PADS
6.0000 | MEDICATED_PAD | Freq: Every day | CUTANEOUS | Status: DC
  Administered 2020-11-08: 6 via TOPICAL

## 2020-11-08 MED ORDER — ACETAMINOPHEN 325 MG PO TABS: 650.0000 mg | ORAL_TABLET | Freq: Four times a day (QID) | ORAL | Status: DC | PRN

## 2020-11-08 MED ORDER — NITROGLYCERIN IN D5W 200-5 MCG/ML-% IV SOLN
0.0000 ug/min | INTRAVENOUS | Status: DC
  Administered 2020-11-08: 5 ug/min via INTRAVENOUS
  Filled 2020-11-08: qty 250

## 2020-11-08 MED ORDER — SODIUM CHLORIDE (PF) 0.9 % IJ SOLN
INTRAMUSCULAR | Status: AC
  Filled 2020-11-08: qty 50

## 2020-11-08 MED ORDER — HYDROMORPHONE HCL 1 MG/ML IJ SOLN
1.0000 mg | Freq: Once | INTRAMUSCULAR | Status: AC
  Administered 2020-11-08: 1 mg via INTRAVENOUS
  Filled 2020-11-08: qty 1

## 2020-11-08 MED ORDER — ROSUVASTATIN CALCIUM 20 MG PO TABS
20.0000 mg | ORAL_TABLET | Freq: Every day | ORAL | Status: DC
  Administered 2020-11-08: 20 mg via ORAL
  Filled 2020-11-08: qty 1

## 2020-11-08 MED ORDER — IOHEXOL 350 MG/ML SOLN
100.0000 mL | Freq: Once | INTRAVENOUS | Status: AC | PRN
  Administered 2020-11-08: 100 mL via INTRAVENOUS

## 2020-11-08 MED ORDER — LABETALOL HCL 5 MG/ML IV SOLN
10.0000 mg | INTRAVENOUS | Status: DC | PRN
  Administered 2020-11-08 – 2020-11-09 (×2): 10 mg via INTRAVENOUS
  Filled 2020-11-08 (×2): qty 4

## 2020-11-08 MED ORDER — METOPROLOL SUCCINATE ER 25 MG PO TB24: 100.0000 mg | ORAL_TABLET | Freq: Every day | ORAL | Status: DC

## 2020-11-08 MED ORDER — ACETAMINOPHEN 650 MG RE SUPP: 650.0000 mg | Freq: Four times a day (QID) | RECTAL | Status: DC | PRN

## 2020-11-08 MED ORDER — MORPHINE SULFATE (PF) 4 MG/ML IV SOLN
4.0000 mg | Freq: Once | INTRAVENOUS | Status: AC
  Administered 2020-11-08: 4 mg via INTRAVENOUS
  Filled 2020-11-08: qty 1

## 2020-11-08 MED ORDER — ONDANSETRON HCL 4 MG PO TABS: 4.0000 mg | ORAL_TABLET | Freq: Four times a day (QID) | ORAL | Status: DC | PRN

## 2020-11-08 MED ORDER — NICARDIPINE HCL IN NACL 20-0.86 MG/200ML-% IV SOLN
3.0000 mg/h | INTRAVENOUS | Status: DC
  Administered 2020-11-08: 5 mg/h via INTRAVENOUS
  Filled 2020-11-08 (×3): qty 200

## 2020-11-08 MED ORDER — HYDROCHLOROTHIAZIDE 25 MG PO TABS
25.0000 mg | ORAL_TABLET | Freq: Every day | ORAL | Status: DC
  Administered 2020-11-09: 25 mg via ORAL
  Filled 2020-11-08: qty 1

## 2020-11-08 MED ORDER — ONDANSETRON HCL 4 MG/2ML IJ SOLN: 4.0000 mg | Freq: Four times a day (QID) | INTRAMUSCULAR | Status: DC | PRN

## 2020-11-08 MED ORDER — GABAPENTIN 300 MG PO CAPS
600.0000 mg | ORAL_CAPSULE | Freq: Three times a day (TID) | ORAL | Status: DC
  Administered 2020-11-08 – 2020-11-09 (×3): 600 mg via ORAL
  Filled 2020-11-08 (×3): qty 2

## 2020-11-08 NOTE — ED Triage Notes (Signed)
Pt complains of chest pain and shortness of breath since yesterday. Feels like someone is sitting on his chest. She took a nitroglycerin this morning w/o relief.

## 2020-11-08 NOTE — H&P (Signed)
History and Physical    Laura Garrett:[id number] DOB: 1970-09-22 DOA: 11/08/2020  PCP: Leonides Sake, MD  Patient coming from: Home  I have personally briefly reviewed patient's old medical records in [place]  Chief Complaint: Chest pain  HPI: Laura Garrett is a 50 y.o. female with medical history significant of anxiety, bipolar disorder, chronic back pain, CAD, current cigarette smoker, hypertension presented to ED with a complaint of chest pain.  Patient, she started having chest pain yesterday evening, this is located anteriorly, radiating to the back, it has been 9-10 out of 10, pressure-like with no aggravating or relieving factor.  She has associated shortness of breath as well as nausea.  Denies any cough, fever, chills, sweating, any problem with urination or with bowel movements.  No recent sick contact or any travel.  Reportedly she was also complaining of some transient numbness to the right arm to the ED.  She did not complain of such to me.  ED Course: Slightly tachycardic, slightly tachypneic but blood pressure was significantly elevated at 240/116.  Her presentation was very typical for possible thoracic aortic dissection so she ended up having CT angiogram of the chest/abdomen and pelvis and surprisingly, she was ruled out of thoracic or abdominal aortic dissection.  She was subsequently started on nitroglycerin drip.  Currently her pain is only 2 out of 10.  Cardiac enzymes x2 were negative as well.  Patient is refusing to be tested for COVID-19 and she is not vaccinated.  I was informed by ED provider that per policy, she will be assigned to [facility] unit.  Review of Systems: As per HPI otherwise negative.    Past Medical History:  Diagnosis Date  . Abdominal aortic atherosclerosis ([place])   . Anxiety   . Atherosclerosis of renal artery ([place]) 10/13/2013  . Bipolar depression ([place]) 10/16/2016  . CAD (coronary artery disease)    a. MI/RCA stent 04/2010 in  [place].  . Chronic back pain    Associated with occasional leg numbness  . Cigarette smoker 03/23/2016  . Dyshidrotic eczema   . History of kidney stones   . History of stent insertion of renal artery 03/23/2016  . HTN (hypertension) 12/29/2010  . Hyperlipidemia 12/29/2010  . Insomnia 10/16/2016  . Leg numbness   . Lymphadenopathy, inguinal    RLE lymphadenopathy on PV dopplers 12/2010  . Myocardial infarct [facility]) 2011   Stent to RCA  . Pelvic pain 10/16/2016  . Peripheral vascular disease ([place])   . Renal artery stenosis ([place]) 12/29/2010  . Renal artery stenosis of unknown cause ([place])    Reported bilateral renal artery stents in 2012, not seen on angiogram 12/2010  . Tobacco use disorder     Past Surgical History:  Procedure Laterality Date  . CORONARY STENT PLACEMENT  2011  . KIDNEY STENT PLACEMENT Bilateral 07/2010   Renal artery stents     reports that she has been smoking cigarettes. She has smoked for the past 15.00 years. She has never used smokeless tobacco. She reports current alcohol use of about 7.0 - 14.0 standard drinks of alcohol per week. She reports current drug use. Drug: Marijuana.  Allergies  Allergen Reactions  . Latex     States "I think I'm allergic to latex"    Family History  Problem Relation Age of Onset  . Hypertension Mother   . Hypertension Brother   . Hypertension Paternal Grandmother   . Hyperlipidemia Paternal Grandmother   . Stroke  Paternal Grandmother   . Kidney disease Paternal Grandmother   . CAD Paternal Grandmother        stent and pacemaker  . Breast cancer Maternal Aunt   . Prostate cancer Father   . Hypertension Father   . Breast cancer Maternal Grandmother     Prior to Admission medications   Medication Sig Start Date End Date Taking? Authorizing Provider  gabapentin (NEURONTIN) 600 MG tablet Take 600 mg by mouth 3 (three) times daily. 07/19/20  Yes [provider]  hydrochlorothiazide (HYDRODIURIL) 25 MG tablet Take 1  tablet (25 mg total) by mouth daily. 09/16/20 12/15/20 Yes Revankar, Reita Cliche, MD  ibuprofen (ADVIL) 200 MG tablet Take 200 mg by mouth every 6 (six) hours as needed for mild pain.   Yes [provider]  meloxicam (MOBIC) 15 MG tablet Take 15 mg by mouth daily.   Yes [provider]  metoprolol succinate (TOPROL-XL) 100 MG 24 hr tablet Take 100 mg by mouth daily. 08/16/20  Yes [provider]  nitroGLYCERIN (NITROSTAT) 0.4 MG SL tablet Place 0.4 mg under the tongue every 5 (five) minutes as needed for chest pain.   Yes [provider]  rosuvastatin (CRESTOR) 20 MG tablet Take 20 mg by mouth at bedtime. 07/20/20  Yes [provider]    Physical Exam: Vitals:   11/08/20 1345 11/08/20 1400 11/08/20 1405 11/08/20 1410  BP: (!) 193/101 (!) 202/98 (!) 192/105 (!) 190/97  Pulse: (!) 49 (!) 47 63 (!) 52  Resp: 17 13 13 16   Temp:      SpO2: 99% 90% 97% 99%  Weight:      Height:        Constitutional: NAD, calm, comfortable Vitals:   11/08/20 1345 11/08/20 1400 11/08/20 1405 11/08/20 1410  BP: (!) 193/101 (!) 202/98 (!) 192/105 (!) 190/97  Pulse: (!) 49 (!) 47 63 (!) 52  Resp: 17 13 13 16   Temp:      SpO2: 99% 90% 97% 99%  Weight:      Height:       Eyes: PERRL, lids and conjunctivae normal ENMT: Mucous membranes are moist. Posterior pharynx clear of any exudate or lesions.Normal dentition.  Neck: normal, supple, no masses, no thyromegaly Respiratory: clear to auscultation bilaterally, no wheezing, no crackles. Normal respiratory effort. No accessory muscle use.  Cardiovascular: Regular rate and rhythm, no murmurs / rubs / gallops. No extremity edema. 2+ pedal pulses. No carotid bruits.  Abdomen: no tenderness, no masses palpated. No hepatosplenomegaly. Bowel sounds positive.  Musculoskeletal: no clubbing / cyanosis. No joint deformity upper and lower extremities. Good ROM, no contractures. Normal muscle tone.  Skin: no rashes, lesions, ulcers. No  induration Neurologic: CN 2-12 grossly intact. Sensation intact, DTR normal. Strength 5/5 in all 4.  Psychiatric: Normal judgment and insight. Alert and oriented x 3. Normal mood.    Labs on Admission: I have personally reviewed following labs and imaging studies  CBC: Recent Labs  Lab 11/08/20 0943  WBC 12.0*  HGB 16.0*  HCT 47.9*  MCV 94.9  PLT 767   Basic Metabolic Panel: Recent Labs  Lab 11/08/20 0943  NA 138  K 3.9  CL 99  CO2 28  GLUCOSE 106*  BUN 12  CREATININE 0.91  CALCIUM 9.9   GFR: Estimated Creatinine Clearance: 70.8 mL/min (by C-G formula based on SCr of 0.91 mg/dL). Liver Function Tests: No results for input(s): AST, ALT, ALKPHOS, BILITOT, PROT, ALBUMIN in the last 168 hours. No  results for input(s): LIPASE, AMYLASE in the last 168 hours. No results for input(s): AMMONIA in the last 168 hours. Coagulation Profile: No results for input(s): INR, PROTIME in the last 168 hours. Cardiac Enzymes: No results for input(s): CKTOTAL, CKMB, CKMBINDEX, TROPONINI in the last 168 hours. BNP (last 3 results) No results for input(s): PROBNP in the last 8760 hours. HbA1C: No results for input(s): HGBA1C in the last 72 hours. CBG: No results for input(s): GLUCAP in the last 168 hours. Lipid Profile: No results for input(s): CHOL, HDL, LDLCALC, TRIG, CHOLHDL, LDLDIRECT in the last 72 hours. Thyroid Function Tests: No results for input(s): TSH, T4TOTAL, FREET4, T3FREE, THYROIDAB in the last 72 hours. Anemia Panel: No results for input(s): VITAMINB12, FOLATE, FERRITIN, TIBC, IRON, RETICCTPCT in the last 72 hours. Urine analysis:    Component Value Date/Time   COLORURINE YELLOW 09/21/2013 1121   APPEARANCEUR CLOUDY (A) 09/21/2013 1121   LABSPEC 1.016 09/21/2013 1121   PHURINE 5.0 09/21/2013 1121   GLUCOSEU NEGATIVE 09/21/2013 1121   HGBUR TRACE (A) 09/21/2013 1121   BILIRUBINUR NEGATIVE 09/21/2013 1121   KETONESUR NEGATIVE 09/21/2013 1121   PROTEINUR NEGATIVE  09/21/2013 1121   UROBILINOGEN 0.2 09/21/2013 1121   NITRITE NEGATIVE 09/21/2013 1121   LEUKOCYTESUR NEGATIVE 09/21/2013 1121    Radiological Exams on Admission: DG Chest 2 View  Result Date: 11/08/2020 CLINICAL DATA:  Chest pain and shortness of breath. EXAM: CHEST - 2 VIEW COMPARISON:  12/02/2015 FINDINGS: The lungs are clear without focal pneumonia, edema, pneumothorax or pleural effusion. The cardiopericardial silhouette is within normal limits for size. The visualized bony structures of the thorax show no acute abnormality. IMPRESSION: No active cardiopulmonary disease. Electronically Signed   By: Misty Stanley M.D.   On: 11/08/2020 10:04   CT Angio Chest/Abd/Pel for Dissection W and/or Wo Contrast  Result Date: 11/08/2020 CLINICAL DATA:  Evaluate for aortic dissection. EXAM: CT ANGIOGRAPHY CHEST, ABDOMEN AND PELVIS TECHNIQUE: Non-contrast CT of the chest was initially obtained. Multidetector CT imaging through the chest, abdomen and pelvis was performed using the standard protocol during bolus administration of intravenous contrast. Multiplanar reconstructed images and MIPs were obtained and reviewed to evaluate the vascular anatomy. CONTRAST:  118mL OMNIPAQUE IOHEXOL 350 MG/ML SOLN COMPARISON:  CT abdomen pelvis-12/13/2016; 09/23/2013; pelvic ultrasound-01/04/2017 FINDINGS: CTA CHEST FINDINGS Vascular Findings: No evidence of thoracic aortic aneurysm or dissection on this nongated examination. There is a minimal amount of predominantly noncalcified atherosclerotic plaque involving the descending thoracic aorta, not resulting in a hemodynamically significant stenosis. Note is made of a small ductus diverticulum. No periaortic stranding. Review of the noncontrast images is negative for the presence of an intramural hematoma. Conventional configuration of the aortic arch. The branch vessels of the aortic arch appear widely patent throughout their imaged courses. Borderline cardiomegaly.  No  pericardial effusion. Although this examination was not tailored for the evaluation the pulmonary arteries, there are no discrete filling defects within the central pulmonary arterial tree to suggest central pulmonary embolism. Normal caliber of the main pulmonary artery. ------------------------------------------------------------- Thoracic aortic measurements: SINOTUBULAR JUNCTION: 27 mm as measured in greatest oblique short axis coronal dimension. PROXIMAL ASCENDING THORACIC AORTA: 31 mm as measured in greatest oblique short axis axial dimension at the level of the main pulmonary artery and approximately 30 mm in greatest oblique short axis coronal diameter (coronal image 45, series 8) AORTIC ARCH: 27 mm as measured in greatest oblique short axis sagittal dimension. PROXIMAL DESCENDING THORACIC AORTA: 24 mm as measured in greatest oblique short  axis axial dimension at the level of the main pulmonary artery. DISTAL DESCENDING THORACIC AORTA: 19 mm as measured in greatest oblique short axis axial dimension at the level of the diaphragmatic hiatus. Review of the MIP images confirms the above findings. ------------------------------------------------------------- Non-Vascular Findings: Mediastinum/Lymph Nodes: No bulky mediastinal, hilar or axillary lymphadenopathy. Lungs/Pleura: Minimal dependent subpleural ground-glass atelectasis, left greater than right, most conspicuously involving the inferior segment of the lingula. No discrete focal airspace opacities. No pleural effusion or pneumothorax. The central pulmonary airways appear widely patent. No discrete pulmonary nodules. Musculoskeletal: No acute or aggressive osseous abnormalities. Regional soft tissues appear normal. Mild heterogeneity of the thyroid gland without discrete nodule. _________________________________________________________ _________________________________________________________ CTA ABDOMEN AND PELVIS FINDINGS VASCULAR Aorta: Moderate amount  of mixed calcified and noncalcified atherosclerotic plaque within normal caliber abdominal aorta, not resulting in hemodynamically significant stenosis. No evidence of abdominal aortic dissection or periaortic stranding. Celiac: There is a minimal amount of noncalcified atherosclerotic plaque involving the superior aspect of the proximal celiac trunk (sagittal image 92, series 10, potentially accentuated due to mass effect from the crus of the diaphragm and not resulting in hemodynamically significant stenosis. Conventional branching pattern. SMA: Widely patent without hemodynamically significant narrowing. Conventional branching pattern. The distal tributaries the SMA appear widely patent without discrete intraluminal filling defect to suggest distal embolism. Renals: Solitary bilaterally; the bilateral renal arteries are widely patent without hemodynamically significant narrowing. No vessel irregularity to suggest FMD. IMA: Remains patent. Inflow: There is a moderate amount of mixed calcified and noncalcified atherosclerotic plaque involving the bilateral common iliac arteries, left greater than right, not resulting in a hemodynamically significant stenosis. The bilateral external and internal iliac arteries are of normal caliber and patent without hemodynamically significant narrowing. Proximal outflow: There is a minimal amount of eccentric mixed calcified and noncalcified atherosclerotic plaque involving the right common femoral artery, not resulting in hemodynamically significant stenosis. The left common femoral artery is widely patent without hemodynamically significant narrowing. The imaged portions of the bilateral deep and superficial femoral arteries are widely patent without hemodynamically significant narrowing. Veins: The IVC and pelvic venous systems appear patent on this arterial phase examination. Review of the MIP images confirms the above findings.  _________________________________________________________ NON-VASCULAR Evaluation of the abdominal organs is largely limited to the arterial phase of enhancement. Hepatobiliary: Normal hepatic contour. There is diffuse decreased attenuation hepatic parenchyma on this postcontrast examination suggestive hepatic steatosis. No discrete hyperenhancing hepatic lesions. Normal appearance of the gallbladder given degree of distention. No radiopaque gallstones. No intra or extrahepatic biliary ductal dilatation. No ascites. Pancreas: Normal appearance of the pancreas. Spleen: Normal appearance of the spleen. Adrenals/Urinary Tract: There is symmetric enhancement of the bilateral kidneys. No evidence of nephrolithiasis on this postcontrast examination. No discrete renal lesions. No urinary obstruction or perinephric stranding. Normal appearance of the bilateral adrenal glands. Normal appearance of the urinary bladder given degree distention. Stomach/Bowel: Moderate colonic stool burden without evidence of enteric obstruction. Normal appearance of the terminal ileum and the retrocecal appendix. No discrete areas of bowel wall thickening. No pneumoperitoneum, pneumatosis or portal venous gas. Lymphatic: No bulky retroperitoneal, mesenteric, pelvic or inguinal lymphadenopathy. Reproductive: Note is made of an approximately 2.4 cm heterogeneously enhancing fibroid within the left side of the uterine fundus (coronal image 69, series 8; axial image 171, series 7). Note is made of an approximately 5.4 x 3.6 cm left-sided adnexal cyst (image 171, series 7) with associated adjacent hydrosalpinx (image 160, series 7) similar to remote abdominal CT performed 09/2013. No  discrete right-sided adnexal lesions. No free fluid the pelvic cul-de-sac. Other: Tiny mesenteric fat containing Peri umbilical hernia. Musculoskeletal: No acute or aggressive osseous abnormalities. Moderate DDD of L5-S1 with disc space height loss, endplate  irregularity and small posteriorly directed disc osteophyte complex at this location. Mild degenerative change the bilateral hips with joint space loss, subchondral sclerosis and a subchondral geode noted within the right femoral head. Review of the MIP images confirms the above findings. IMPRESSION: Chest CTA Impression: 1. No acute cardiopulmonary disease. Specifically, no evidence of thoracic aortic aneurysm or dissection. No evidence of central pulmonary embolism. 2. Borderline cardiomegaly. Further evaluation cardiac echo could performed as indicated. Abdomen and pelvic CTA impression: 1. No acute findings within the abdomen or pelvis. 2. Moderate amount of atherosclerotic plaque within normal caliber abdominal aorta, not resulting in hemodynamically significant stenosis. Aortic Atherosclerosis (ICD10-I70.0). 3. Fibroid uterus with note made of an approximately 5.4 cm left-sided presumably physiologic adnexal cyst with associated adjacent hydrosalpinx, similar to remote abdominal CT performed 09/2013. By report, patient is scheduled to undergo hysterectomy. 4. Suspected hepatic steatosis.  Correlation with LFTs is advised. Electronically Signed   By: Sandi Mariscal M.D.   On: 11/08/2020 11:41    EKG: Independently reviewed.  EKG is not completed yet.  Assessment/Plan Active Problems:   CAD (coronary artery disease)   Hyperlipidemia   Bipolar depression (HCC)   Cigarette smoker   History of stent insertion of renal artery   Chronic back pain   Hypertensive emergency   Hypertensive emergency/chest pain: Troponin x2 negative.  CT negative for aortic dissection.  Will admit to ICU and will continue nitroglycerin drip.  Her recent blood pressure is 130/90.  Her pain is improved to 2 out of 10.  We will order transthoracic echo.  Resume home medications and gradually transition off of nitroglycerin drip.  Leukocytosis: Reactive.  No signs of infection.  Hyperlipidemia: Resume Crestor.  DVT  prophylaxis: SCDs Start: 11/08/20 1414, avoiding heparin products for at least 24 hours Code Status: Full code Family Communication: None present at bedside.  Plan of care discussed with patient in length and he verbalized understanding and agreed with it. Disposition Plan: To be determined.  Consults called: None Admission status: Inpatient   Status is: Inpatient  Remains inpatient appropriate because:Inpatient level of care appropriate due to severity of illness   Dispo: The patient is from: Home              Anticipated d/c is to: Home              Patient currently is not medically stable to d/c.   Difficult to place patient No       Darliss Cheney MD [facility]  11/08/2020, 2:30 PM  To contact the attending provider between 7A-7P or the covering provider during after hours 7P-7A, please log into the web site [email or web address]

## 2020-11-08 NOTE — Progress Notes (Signed)
*  PRELIMINARY RESULTS* Echocardiogram 2D Echocardiogram has been performed.  Leavy Cella 11/08/2020, 3:54 PM

## 2020-11-08 NOTE — ED Notes (Signed)
Patient transported to CT 

## 2020-11-08 NOTE — ED Provider Notes (Signed)
[facility] DEPT Provider Note   CSN: [id number] Arrival date & time: 11/08/20  2694     History Chief Complaint  Patient presents with  . Chest Pain    Laura Garrett is a 50 y.o. female.  HPI Patient presents with chest pain, back pain, dyspnea. Patient has history of multiple medical issues, including CAD, hypertension, refractory to hydrochlorothiazide and metoprolol. Over the past 2 days patient has had more substantial pain than usual, sternal, radiating to the back, with associated transient numbness in the right arm.  There is continuous dyspnea.  She has taken her medication as directed, including metoprolol, and hydralazine.  No syncope, no fever, no cough.     Past Medical History:  Diagnosis Date  . Abdominal aortic atherosclerosis ([place])   . Anxiety   . Atherosclerosis of renal artery ([place]) 10/13/2013  . Bipolar depression ([place]) 10/16/2016  . CAD (coronary artery disease)    a. MI/RCA stent 04/2010 in [place].  . Chronic back pain    Associated with occasional leg numbness  . Cigarette smoker 03/23/2016  . Dyshidrotic eczema   . History of kidney stones   . History of stent insertion of renal artery 03/23/2016  . HTN (hypertension) 12/29/2010  . Hyperlipidemia 12/29/2010  . Insomnia 10/16/2016  . Leg numbness   . Lymphadenopathy, inguinal    RLE lymphadenopathy on PV dopplers 12/2010  . Myocardial infarct [facility]) 2011   Stent to RCA  . Pelvic pain 10/16/2016  . Peripheral vascular disease ([place])   . Renal artery stenosis ([place]) 12/29/2010  . Renal artery stenosis of unknown cause ([place])    Reported bilateral renal artery stents in 2012, not seen on angiogram 12/2010  . Tobacco use disorder     Patient Active Problem List   Diagnosis Date Noted  . Chronic back pain   . Dyshidrotic eczema   . History of kidney stones   . Leg numbness   . Lymphadenopathy, inguinal   . Renal artery stenosis of unknown cause ([place])   . Tobacco  use disorder   . Abdominal aortic atherosclerosis ([place])   . Anxiety   . Peripheral vascular disease ([place])   . Bipolar depression ([place]) 10/16/2016  . Insomnia 10/16/2016  . Pelvic pain 10/16/2016  . Cigarette smoker 03/23/2016  . History of stent insertion of renal artery 03/23/2016  . Atherosclerosis of renal artery ([place]) 10/13/2013  . CAD (coronary artery disease) 12/29/2010  . HTN (hypertension) 12/29/2010  . Hyperlipidemia 12/29/2010  . Renal artery stenosis ([place]) 12/29/2010  . Myocardial infarct [facility]) 2011    Past Surgical History:  Procedure Laterality Date  . CORONARY STENT PLACEMENT  2011  . KIDNEY STENT PLACEMENT Bilateral 07/2010   Renal artery stents     OB History   No obstetric history on file.     Family History  Problem Relation Age of Onset  . Hypertension Mother   . Hypertension Brother   . Hypertension Paternal Grandmother   . Hyperlipidemia Paternal Grandmother   . Stroke Paternal Grandmother   . Kidney disease Paternal Grandmother   . CAD Paternal Grandmother        stent and pacemaker  . Breast cancer Maternal Aunt   . Prostate cancer Father   . Hypertension Father   . Breast cancer Maternal Grandmother     Social History   Tobacco Use  . Smoking status: Current Every Day Smoker    Years: 15.00    Types: Cigarettes  Last attempt to quit: 07/16/2016    Years since quitting: 4.3  . Smokeless tobacco: Never Used  Substance Use Topics  . Alcohol use: Yes    Alcohol/week: 7.0 - 14.0 standard drinks    Types: 7 - 14 Cans of beer per week    Comment: 1-2 beers once-twice week  . Drug use: Yes    Types: Marijuana    Comment: Daily marijuana    Home Medications Prior to Admission medications   Medication Sig Start Date End Date Taking? Authorizing Provider  amLODipine-valsartan (EXFORGE) 10-320 MG tablet Take 1 tablet by mouth daily. 07/19/20   [provider]  gabapentin (NEURONTIN) 600 MG tablet Take 600 mg by mouth 3 (three)  times daily. 07/19/20   [provider]  hydrochlorothiazide (HYDRODIURIL) 25 MG tablet Take 1 tablet (25 mg total) by mouth daily. 09/16/20 12/15/20  Revankar, Reita Cliche, MD  ibuprofen (ADVIL) 200 MG tablet Take 200 mg by mouth every 6 (six) hours as needed for mild pain.    [provider]  meloxicam (MOBIC) 15 MG tablet Take 15 mg by mouth daily.    [provider]  metoprolol succinate (TOPROL-XL) 100 MG 24 hr tablet Take 100 mg by mouth daily. 08/16/20   [provider]  nitroGLYCERIN (NITROSTAT) 0.4 MG SL tablet Place 0.4 mg under the tongue every 5 (five) minutes as needed for chest pain.    [provider]  rosuvastatin (CRESTOR) 20 MG tablet Take 20 mg by mouth at bedtime. 07/20/20   [provider]    Allergies    Latex  Review of Systems   Review of Systems  Constitutional:       Per HPI, otherwise negative  HENT:       Per HPI, otherwise negative  Respiratory:       Per HPI, otherwise negative  Cardiovascular:       Per HPI, otherwise negative  Gastrointestinal: Negative for vomiting.  Endocrine:       Negative aside from HPI  Genitourinary:       Neg aside from HPI   Musculoskeletal:       Per HPI, otherwise negative  Skin: Negative.   Neurological: Negative for syncope.    Physical Exam Updated Vital Signs BP (!) 217/104   Pulse (!) 58   Temp 98 F (36.7 C)   Resp 12   Ht 5\' 6"  (1.676 m)   Wt 63.5 kg   LMP 11/01/2020   SpO2 100%   BMI 22.60 kg/m   Physical Exam Vitals and nursing note reviewed.  Constitutional:      General: She is not in acute distress.    Appearance: She is well-developed.  HENT:     Head: Normocephalic and atraumatic.  Eyes:     Conjunctiva/sclera: Conjunctivae normal.  Cardiovascular:     Rate and Rhythm: Normal rate and regular rhythm.  Pulmonary:     Effort: Pulmonary effort is normal. No respiratory distress.     Breath sounds: Normal breath sounds. No stridor.  Abdominal:      General: There is no distension.  Skin:    General: Skin is warm and dry.  Neurological:     Mental Status: She is alert and oriented to person, place, and time.     Cranial Nerves: No cranial nerve deficit.      ED Results / Procedures / Treatments   Labs (all labs ordered are listed, but only abnormal results are displayed) Labs Reviewed  BASIC METABOLIC PANEL - Abnormal; Notable for the following components:      Result Value   Glucose, Bld 106 (*)    All other components within normal limits  CBC - Abnormal; Notable for the following components:   WBC 12.0 (*)    Hemoglobin 16.0 (*)    HCT 47.9 (*)    All other components within normal limits  D-DIMER, QUANTITATIVE - Abnormal; Notable for the following components:   D-Dimer, Quant 0.77 (*)    All other components within normal limits  I-STAT BETA HCG BLOOD, ED (MC, [facility], AP ONLY) - Abnormal; Notable for the following components:   I-stat hCG, quantitative 8.5 (*)    All other components within normal limits  TROPONIN I (HIGH SENSITIVITY)    EKG 52, sinus, LVH, nonspecific ST wave changes, abnormal  Radiology DG Chest 2 View  Result Date: 11/08/2020 CLINICAL DATA:  Chest pain and shortness of breath. EXAM: CHEST - 2 VIEW COMPARISON:  12/02/2015 FINDINGS: The lungs are clear without focal pneumonia, edema, pneumothorax or pleural effusion. The cardiopericardial silhouette is within normal limits for size. The visualized bony structures of the thorax show no acute abnormality. IMPRESSION: No active cardiopulmonary disease. Electronically Signed   By: Misty Stanley M.D.   On: 11/08/2020 10:04    Procedures Procedures   Medications Ordered in ED Medications  labetalol (NORMODYNE) injection 10 mg (10 mg Intravenous Given 11/08/20 0946)    ED Course  I have reviewed the triage vital signs and the nursing notes.  Pertinent labs & imaging results that were available during my care of the patient were reviewed by me and  considered in my medical decision making (see chart for details). Cardiac 60 sinus hypertrophic changes, abnormal Pulse ox 99% room air normal  On initial evaluation patient found to have substantial hypertension, 245/120.  Patient started on labetalol pushes, x3.  With consideration of ACS including dissection patient have labs ordered, placed on continuous cardiac monitoring.   10:31 AM Blood pressure now diminished, D-dimer positive with positive aortic dissection risk score, patient will have CT angiography rule out dissection.     1:58 PM Now on a nitroglycerin drip patient's blood pressure is 170/100. She and I again discussed all findings, no evidence for aortic dissection.  She continues to have some pain, but given her substantial improved blood pressure, this may be residual to hypertensive emergency.  No evidence for ACS, PE, pneumothorax.  MDM Rules/Calculators/A&P MDM Number of Diagnoses or Management Options Atypical chest pain: new, needed workup Hypertensive emergency: new, needed workup   Amount and/or Complexity of Data Reviewed Clinical lab tests: ordered and reviewed Tests in the radiology section of CPT: ordered and reviewed Tests in the medicine section of CPT: reviewed and ordered Decide to obtain previous medical records or to obtain history from someone other than the patient: yes Review and summarize past medical records: yes Discuss the patient with other providers: yes Independent visualization of images, tracings, or specimens: yes  Risk of Complications, Morbidity, and/or Mortality Presenting problems: high Diagnostic procedures: high Management options: high  Critical Care Total time providing critical care: 30-74 minutes (35)  Patient Progress Patient progress: stable  Final Clinical Impression(s) / ED Diagnoses Final diagnoses:  Hypertensive emergency  Atypical chest pain     Carmin Muskrat, MD 11/08/20 1400

## 2020-11-08 NOTE — ED Notes (Signed)
Laura Garrett, EDP aware of patient HR in the 40-50s. No new orders.

## 2020-11-08 NOTE — ED Notes (Signed)
Patient refusing COVID test.  Vanita Panda, EDP made aware.

## 2020-11-09 ENCOUNTER — Encounter (HOSPITAL_COMMUNITY): Payer: Self-pay | Admitting: Family Medicine

## 2020-11-09 DIAGNOSIS — I2510 Atherosclerotic heart disease of native coronary artery without angina pectoris: Secondary | ICD-10-CM

## 2020-11-09 DIAGNOSIS — R079 Chest pain, unspecified: Secondary | ICD-10-CM

## 2020-11-09 DIAGNOSIS — F17210 Nicotine dependence, cigarettes, uncomplicated: Secondary | ICD-10-CM

## 2020-11-09 DIAGNOSIS — F319 Bipolar disorder, unspecified: Secondary | ICD-10-CM | POA: Diagnosis not present

## 2020-11-09 DIAGNOSIS — I160 Hypertensive urgency: Secondary | ICD-10-CM

## 2020-11-09 DIAGNOSIS — I161 Hypertensive emergency: Secondary | ICD-10-CM | POA: Diagnosis not present

## 2020-11-09 DIAGNOSIS — E782 Mixed hyperlipidemia: Secondary | ICD-10-CM

## 2020-11-09 DIAGNOSIS — Z9889 Other specified postprocedural states: Secondary | ICD-10-CM

## 2020-11-09 DIAGNOSIS — D72829 Elevated white blood cell count, unspecified: Secondary | ICD-10-CM

## 2020-11-09 MED ORDER — ONDANSETRON HCL 4 MG PO TABS: 4.0000 mg | ORAL_TABLET | Freq: Four times a day (QID) | ORAL | 0 refills | Status: AC | PRN

## 2020-11-09 MED ORDER — AMLODIPINE BESYLATE 10 MG PO TABS
10.0000 mg | ORAL_TABLET | Freq: Every day | ORAL | Status: DC
  Administered 2020-11-09: 10 mg via ORAL
  Filled 2020-11-09: qty 1

## 2020-11-09 MED ORDER — AMLODIPINE BESYLATE 10 MG PO TABS: 10.0000 mg | ORAL_TABLET | Freq: Every day | ORAL | 0 refills | Status: DC

## 2020-11-09 MED ORDER — HYDRALAZINE HCL 25 MG PO TABS
25.0000 mg | ORAL_TABLET | Freq: Three times a day (TID) | ORAL | Status: DC
  Administered 2020-11-09 (×2): 25 mg via ORAL
  Filled 2020-11-09 (×2): qty 1

## 2020-11-09 MED ORDER — METOPROLOL SUCCINATE ER 25 MG PO TB24
25.0000 mg | ORAL_TABLET | Freq: Every day | ORAL | 0 refills | Status: AC
Start: 2020-11-10 — End: ?

## 2020-11-09 MED ORDER — HYDRALAZINE HCL 25 MG PO TABS: 75.0000 mg | ORAL_TABLET | Freq: Three times a day (TID) | ORAL | 0 refills | Status: AC

## 2020-11-09 NOTE — Progress Notes (Signed)
Patient insisting on leaving AMA. Patient is alert and oriented, risks of leaving due to elevated blood pressure explained to patient. Hospitalist and cardiology made aware of patient's decision. Patient understood risks and stated she wishes to leave. IV removed and belongings returned to patient. Patient's husband at bedside. AMA form signed.

## 2020-11-09 NOTE — TOC Initial Note (Signed)
Transition of Care [facility]) - Initial/Assessment Note    Patient Details  Name: Laura Garrett MRN: [id number] Date of Birth: 1970/07/01  Transition of [facility]) CM/SW Contact:    Leeroy Cha, RN Phone Number: 11/09/2020, 7:11 AM  Clinical Narrative:                 50 y.o. female with medical history significant of anxiety, bipolar disorder, chronic back pain, CAD, current cigarette smoker, hypertension presented to ED with a complaint of chest pain.  Patient, she started having chest pain yesterday evening, this is located anteriorly, radiating to the back, it has been 9-10 out of 10, pressure-like with no aggravating or relieving factor.  She has associated shortness of breath as well as nausea.  Denies any cough, fever, chills, sweating, any problem with urination or with bowel movements.  No recent sick contact or any travel.  Reportedly she was also complaining of some transient numbness to the right arm to the ED.  She did not complain of such to me.  ED Course: Slightly tachycardic, slightly tachypneic but blood pressure was significantly elevated at 240/116.  Her presentation was very typical for possible thoracic aortic dissection so she ended up having CT angiogram of the chest/abdomen and pelvis and surprisingly, she was ruled out of thoracic or abdominal aortic dissection.  She was subsequently started on nitroglycerin drip.  Currently her pain is only 2 out of 10.  Cardiac enzymes x2 were negative as well.  Patient is refusing to be tested for COVID-19 and she is not vaccinated.  I was informed by ED provider that per policy, she will be assigned to [place] unit.  PLAN: to return to home with self care.  BP last pm=249/163 started on iv cardene and ntg this am bp=121/104. Expected Discharge Plan: Home/Self Care Barriers to Discharge: Continued Medical Work up   Patient Goals and CMS Choice Patient states their goals for this hospitalization and ongoing recovery are:: to go  home CMS Medicare.gov Compare Post Acute Care list provided to:: Patient    Expected Discharge Plan and Services Expected Discharge Plan: Home/Self Care   Discharge Planning Services: CM Consult   Living arrangements for the past 2 months: Single Family Home                                      Prior Living Arrangements/Services Living arrangements for the past 2 months: Single Family Home Lives with:: Self Patient language and need for interpreter reviewed:: Yes Do you feel safe going back to the place where you live?: Yes            Criminal Activity/Legal Involvement Pertinent to Current Situation/Hospitalization: No - Comment as needed  Activities of Daily Living Home Assistive Devices/Equipment: Eyeglasses ADL Screening (condition at time of admission) Patient's cognitive ability adequate to safely complete daily activities?: Yes Is the patient deaf or have difficulty hearing?: No Does the patient have difficulty seeing, even when wearing glasses/contacts?: No Does the patient have difficulty concentrating, remembering, or making decisions?: No Patient able to express need for assistance with ADLs?: Yes Does the patient have difficulty dressing or bathing?: No Independently performs ADLs?: Yes (appropriate for developmental age) Does the patient have difficulty walking or climbing stairs?: No Weakness of Legs: None Weakness of Arms/Hands: None  Permission Sought/Granted  Emotional Assessment Appearance:: Appears stated age Attitude/Demeanor/Rapport: Engaged Affect (typically observed): Calm Orientation: : Oriented to Place,Oriented to Self,Oriented to  Time,Oriented to Situation Alcohol / Substance Use: Tobacco Use Psych Involvement: No (comment)  Admission diagnosis:  Atypical chest pain [R07.89] Hypertensive emergency [I16.1] Patient Active Problem List   Diagnosis Date Noted  . Hypertensive emergency 11/08/2020  . Leukocytosis  11/08/2020  . Chronic back pain   . Dyshidrotic eczema   . History of kidney stones   . Leg numbness   . Lymphadenopathy, inguinal   . Renal artery stenosis of unknown cause ([place])   . Tobacco use disorder   . Abdominal aortic atherosclerosis ([place])   . Anxiety   . Peripheral vascular disease ([place])   . Bipolar depression ([place]) 10/16/2016  . Insomnia 10/16/2016  . Pelvic pain 10/16/2016  . Cigarette smoker 03/23/2016  . History of stent insertion of renal artery 03/23/2016  . Atherosclerosis of renal artery ([place]) 10/13/2013  . CAD (coronary artery disease) 12/29/2010  . HTN (hypertension) 12/29/2010  . Hyperlipidemia 12/29/2010  . Renal artery stenosis ([place]) 12/29/2010  . Myocardial infarct [facility]) 2011   PCP:  Leonides Sake, MD Pharmacy:   [id number] [place], [address] [place] [address] Phone: [phone] Fax: [phone]  [id number] [place], [place] [address] [place] [address] Phone: [phone] Fax: [phone]     Social Determinants of Health (SDOH) Interventions    Readmission Risk Interventions No flowsheet data found.

## 2020-11-09 NOTE — Discharge Summary (Signed)
Physician Discharge Summary  Laura Garrett WEX:[id number] DOB: 11-27-1970 DOA: 11/08/2020  PCP: Leonides Sake, MD  Admit date: 11/08/2020 Discharge date: 11/09/2020  Admitted From: Home Disposition: Left AMA  Recommendations for Outpatient Follow-up:  1. Follow up with PCP in 1-2 weeks 2. Follow up With Crdiology within 1 to 2 weeks 3. Please obtain CMP/CBC, Mag, Phos in one week 4. Please follow up on the following pending results:  Home Health: No Equipment/Devices: None    Discharge Condition: Guarded CODE STATUS: FULL CODE Diet recommendation: Heart Healthy Diet   Brief/Interim Summary: The patient is a 50 year old African-American female with a past medical history significant for but not limited to anxiety, bipolar disorder, chronic back pain, history of CAD status post stenting of the right RCA, tobacco abuse with current cigarette smoking, hypertension as well as other comorbidities who presented to the emergency room with a chief complaint of chest pain.  She states that she developed chest pain the evening prior to admission and described it located anteriorly radiating to the back and described as a 9 out of 10 in severity with no aggravating or alleviating factors.  She also noted shortness of breath as well as nausea as associated symptoms.  She denies any cough, fevers or chills and reportedly she was complaining of some transient numbness to the right arm in the ED.  In the ED she is slightly tachycardic and tachypneic but blood pressure was significant elevated to 240/116.  Her presentation was typical for possible thoracic aortic dissection so she not having a CT of the chest abdomen pelvis and was ruled out for any abdominal aortic dissection.  She was started on a nitroglycerin drip as well as a nicardipine drip and cardiac enzymes were negative x2.  Patient refused to be tested for COVID-19 and is not vaccinated so she is assigned to the COVID unit per the policy.  Her  blood pressure improved overnight and she was weaned off of her nicardipine drip and nitroglycerin drip.  However this morning her blood pressure significantly shot back up again so amlodipine as well as hydralazine were added.  Cardiology was consulted for further evaluation recommendations and adjusted some of her regimen however subsequently patient signed out [place] understanding the risks of leaving AMA with possible worsening, decompensation and even possible death and she understood and accepted these risks on her own accord.  Discharge Diagnoses:  Active Problems:   CAD (coronary artery disease)   Hyperlipidemia   Bipolar depression (HCC)   Cigarette smoker   History of stent insertion of renal artery   Chronic back pain   Hypertensive emergency   Leukocytosis   Hypertensive Emergency in the setting of Renal Artery Stenosis Poorly controlled hypertension -Admitted to Inpatient ICU for Chest Pain and Hypertensive Emergency  -BP was elevated to 249/117 on Admission; Now is improved to 121/104 this AM But worsened with activity and shot back up to 209/119 -She remained on a Nitro gtt and Nicardepine and subsequently weaned off -We continued her hydrochlorothiazide 25 mg p.o. daily and adjusted her metoprolol given her bradycardia down to 25 mg daily; also added amlodipine 10 mg p.o. daily as well as hydralazine 25 mg 3 times daily but then cardiology increase this to 75 mg 3 times daily -Patient did not want further evaluation and subsequently she signed out [place] despite the risks of worsening, decompensation with chest pain and possible stroke and even possible death; patient was of sound mind when  she made the decision to sign out [place]; he has been refusing all interventions and also refused her lab work this morning -We will need to follow-up with cardiology for adequate blood pressure regimen as she is signing out [place]  CAD status post RCA stenting in 2019 Chest pain rule out ACS -Her chest pain was likely mediated in the setting of her significantly elevated blood pressure on arrival -Chest pain has resolved and pressure has been reduced Echocardiogram was done and showed normal EF with normal right wall motion abnormality -High-sensitivity troponin was negative x2 with Troponin being 4 x2 -EKG was ordered but patient refused -Cardiology recommending no ischemic evaluation inpatient at this time and recommending focus on risk factor management as she continues to smoke -C/w SL Nitroglycerin  -Patient signed out against medical vice  Elevated D-dimer -D-dimer was elevated at 0.77 -CT of the chest done but showed no central pulmonary embolus -Wanted to evaluate her for lower extremity venous duplex with a VTE scan however she signed out [place]  Hyperglycemia -Patient blood sugar was elevated at 106 -Would not allow [place] to recheck her CMP today and recommend outpatient hemoglobin A1c as she is signing out AGAINST MEDICAL ADVICE  Leukocytosis -WBC was 12.0 and likely reactive but repeat not done as patient refused her labs this morning -She is signing out [place]  Erythrocytosis -Patient's hemoglobin/hematocrit was 16.0/47.9 -Wanted to repeat this morning however she did not allow [place] that she refused labs and signed out [place]  PAD -Had renal artery duplex done in 2018 which showed "Bilateral renal arteries are patent with mildly elevated   velocities suggestive of 1-59% stenosis. 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 However, tortuosity if noted which may overestimate these velocities."  -Will need to follow-up with vascular surgery in outpatient setting and may need another renal artery duplex as she is signing out [place] -C/w Rosuvastatin 20 mg po qHS   Discharge  Instructions  Allergies as of 11/09/2020      Reactions   Latex    States "I think I'm allergic to latex"      Medication List    STOP taking these medications   ibuprofen 200 MG tablet Commonly known as: ADVIL   meloxicam 15 MG tablet Commonly known as: MOBIC     TAKE these medications   amLODipine 10 MG tablet Commonly known as: NORVASC Take 1 tablet (10 mg total) by mouth daily. Start taking on: November 10, 2020   gabapentin 600 MG tablet Commonly known as: NEURONTIN Take 600 mg by mouth 3 (three) times daily.   hydrALAZINE 25 MG tablet Commonly known as: APRESOLINE Take 3 tablets (75 mg total) by mouth 3 (three) times daily.   hydrochlorothiazide 25 MG tablet Commonly known as: HYDRODIURIL Take 1 tablet (25 mg total) by mouth daily.   metoprolol succinate 25 MG 24 hr tablet Commonly known as: TOPROL-XL Take 1 tablet (25 mg total) by mouth daily. Start taking on: November 10, 2020 What changed:   medication strength  how much to take   nitroGLYCERIN 0.4 MG SL tablet Commonly known as: NITROSTAT Place 0.4 mg under the tongue every 5 (five) minutes as needed for chest pain.   ondansetron 4 MG tablet Commonly known as: ZOFRAN Take 1 tablet (4 mg total) by mouth every 6 (six) hours as needed for nausea.   rosuvastatin 20  MG tablet Commonly known as: CRESTOR Take 20 mg by mouth at bedtime.       Allergies  Allergen Reactions  . Latex     States "I think I'm allergic to latex"   Consultations:  Cardiology  Procedures/Studies: DG Chest 2 View  Result Date: 11/08/2020 CLINICAL DATA:  Chest pain and shortness of breath. EXAM: CHEST - 2 VIEW COMPARISON:  12/02/2015 FINDINGS: The lungs are clear without focal pneumonia, edema, pneumothorax or pleural effusion. The cardiopericardial silhouette is within normal limits for size. The visualized bony structures of the thorax show no acute abnormality. IMPRESSION: No active cardiopulmonary disease. Electronically  Signed   By: Misty Stanley M.D.   On: 11/08/2020 10:04   ECHOCARDIOGRAM COMPLETE  Result Date: 11/08/2020    ECHOCARDIOGRAM REPORT   Patient Name:   SAMIYYAH MOFFA Date of Exam: 11/08/2020 Medical Rec #:  [id number]           Height:       66.0 in Accession #:    [id number]          Weight:       140.0 lb Date of Birth:  [birth year]           BSA:          1.719 m Patient Age:    77 years            BP:           133/91 mmHg Patient Gender: F                   HR:           53 bpm. Exam Location:  Inpatient Procedure: 2D Echo Indications:     Chest Pain R07.9  History:         Patient has prior history of Echocardiogram examinations, most                  recent 08/23/2020. CAD and Previous Myocardial Infarction; Risk                  Factors:Hypertension, Dyslipidemia and Current Smoker. MI/RCA                  stent 04/2010.  Sonographer:     Leavy Cella Referring Phys:  [id number] RAVI PAHWANI Diagnosing Phys: Oswaldo Milian MD IMPRESSIONS  1. Left ventricular ejection fraction, by estimation, is 60 to 65%. The left ventricle has normal function. The left ventricle has no regional wall motion abnormalities. There is moderate left ventricular hypertrophy. Left ventricular diastolic parameters are indeterminate.  2. Right ventricular systolic function is normal. The right ventricular size is normal. Tricuspid regurgitation signal is inadequate for assessing PA pressure.  3. The mitral valve is normal in structure. No evidence of mitral valve regurgitation.  4. The aortic valve is tricuspid. Aortic valve regurgitation is mild. No aortic stenosis is present. FINDINGS  Left Ventricle: Left ventricular ejection fraction, by estimation, is 60 to 65%. The left ventricle has normal function. The left ventricle has no regional wall motion abnormalities. The left ventricular internal cavity size was normal in size. There is  moderate left ventricular hypertrophy. Left ventricular diastolic parameters are  indeterminate. Right Ventricle: The right ventricular size is normal. No increase in right ventricular wall thickness. Right ventricular systolic function is normal. Tricuspid regurgitation signal is inadequate for assessing PA pressure. Left Atrium: Left atrial size was normal in size. Right Atrium: Right atrial size was  normal in size. Pericardium: There is no evidence of pericardial effusion. Mitral Valve: The mitral valve is normal in structure. No evidence of mitral valve regurgitation. Tricuspid Valve: The tricuspid valve is normal in structure. Tricuspid valve regurgitation is not demonstrated. Aortic Valve: The aortic valve is tricuspid. Aortic valve regurgitation is mild. Aortic regurgitation PHT measures 860 msec. No aortic stenosis is present. Pulmonic Valve: The pulmonic valve was not well visualized. Pulmonic valve regurgitation is not visualized. Aorta: The aortic root and ascending aorta are structurally normal, with no evidence of dilitation. IAS/Shunts: The interatrial septum was not well visualized.  LEFT VENTRICLE PLAX 2D LVIDd:         4.25 cm Diastology LVIDs:         2.35 cm LV e' medial:    5.11 cm/s LV PW:         1.39 cm LV E/e' medial:  7.3 LV IVS:        1.31 cm LV e' lateral:   6.08 cm/s                        LV E/e' lateral: 6.1  RIGHT VENTRICLE RV S prime:     13.40 cm/s TAPSE (M-mode): 2.6 cm LEFT ATRIUM             Index       RIGHT ATRIUM          Index LA diam:        3.30 cm 1.92 cm/m  RA Area:     9.31 cm LA Vol (A2C):   45.7 ml 26.59 ml/m RA Volume:   17.40 ml 10.12 ml/m LA Vol (A4C):   30.6 ml 17.81 ml/m LA Biplane Vol: 38.4 ml 22.34 ml/m  AORTIC VALVE AI PHT:      860 msec  AORTA Ao Root diam: 2.90 cm MITRAL VALVE MV Area (PHT): 3.12 cm MV Decel Time: 243 msec MV E velocity: 37.20 cm/s MV A velocity: 36.60 cm/s MV E/A ratio:  1.02 Oswaldo Milian MD Electronically signed by Oswaldo Milian MD Signature Date/Time: 11/08/2020/6:41:46 PM    Final (Updated)    CT  Angio Chest/Abd/Pel for Dissection W and/or Wo Contrast  Result Date: 11/08/2020 CLINICAL DATA:  Evaluate for aortic dissection. EXAM: CT ANGIOGRAPHY CHEST, ABDOMEN AND PELVIS TECHNIQUE: Non-contrast CT of the chest was initially obtained. Multidetector CT imaging through the chest, abdomen and pelvis was performed using the standard protocol during bolus administration of intravenous contrast. Multiplanar reconstructed images and MIPs were obtained and reviewed to evaluate the vascular anatomy. CONTRAST:  176mL OMNIPAQUE IOHEXOL 350 MG/ML SOLN COMPARISON:  CT abdomen pelvis-12/13/2016; 09/23/2013; pelvic ultrasound-01/04/2017 FINDINGS: CTA CHEST FINDINGS Vascular Findings: No evidence of thoracic aortic aneurysm or dissection on this nongated examination. There is a minimal amount of predominantly noncalcified atherosclerotic plaque involving the descending thoracic aorta, not resulting in a hemodynamically significant stenosis. Note is made of a small ductus diverticulum. No periaortic stranding. Review of the noncontrast images is negative for the presence of an intramural hematoma. Conventional configuration of the aortic arch. The branch vessels of the aortic arch appear widely patent throughout their imaged courses. Borderline cardiomegaly.  No pericardial effusion. Although this examination was not tailored for the evaluation the pulmonary arteries, there are no discrete filling defects within the central pulmonary arterial tree to suggest central pulmonary embolism. Normal caliber of the main pulmonary artery. ------------------------------------------------------------- Thoracic aortic measurements: SINOTUBULAR JUNCTION: 27 mm as measured in greatest oblique short  axis coronal dimension. PROXIMAL ASCENDING THORACIC AORTA: 31 mm as measured in greatest oblique short axis axial dimension at the level of the main pulmonary artery and approximately 30 mm in greatest oblique short axis coronal diameter  (coronal image 45, series 8) AORTIC ARCH: 27 mm as measured in greatest oblique short axis sagittal dimension. PROXIMAL DESCENDING THORACIC AORTA: 24 mm as measured in greatest oblique short axis axial dimension at the level of the main pulmonary artery. DISTAL DESCENDING THORACIC AORTA: 19 mm as measured in greatest oblique short axis axial dimension at the level of the diaphragmatic hiatus. Review of the MIP images confirms the above findings. ------------------------------------------------------------- Non-Vascular Findings: Mediastinum/Lymph Nodes: No bulky mediastinal, hilar or axillary lymphadenopathy. Lungs/Pleura: Minimal dependent subpleural ground-glass atelectasis, left greater than right, most conspicuously involving the inferior segment of the lingula. No discrete focal airspace opacities. No pleural effusion or pneumothorax. The central pulmonary airways appear widely patent. No discrete pulmonary nodules. Musculoskeletal: No acute or aggressive osseous abnormalities. Regional soft tissues appear normal. Mild heterogeneity of the thyroid gland without discrete nodule. _________________________________________________________ _________________________________________________________ CTA ABDOMEN AND PELVIS FINDINGS VASCULAR Aorta: Moderate amount of mixed calcified and noncalcified atherosclerotic plaque within normal caliber abdominal aorta, not resulting in hemodynamically significant stenosis. No evidence of abdominal aortic dissection or periaortic stranding. Celiac: There is a minimal amount of noncalcified atherosclerotic plaque involving the superior aspect of the proximal celiac trunk (sagittal image 92, series 10, potentially accentuated due to mass effect from the crus of the diaphragm and not resulting in hemodynamically significant stenosis. Conventional branching pattern. SMA: Widely patent without hemodynamically significant narrowing. Conventional branching pattern. The distal tributaries  the SMA appear widely patent without discrete intraluminal filling defect to suggest distal embolism. Renals: Solitary bilaterally; the bilateral renal arteries are widely patent without hemodynamically significant narrowing. No vessel irregularity to suggest FMD. IMA: Remains patent. Inflow: There is a moderate amount of mixed calcified and noncalcified atherosclerotic plaque involving the bilateral common iliac arteries, left greater than right, not resulting in a hemodynamically significant stenosis. The bilateral external and internal iliac arteries are of normal caliber and patent without hemodynamically significant narrowing. Proximal outflow: There is a minimal amount of eccentric mixed calcified and noncalcified atherosclerotic plaque involving the right common femoral artery, not resulting in hemodynamically significant stenosis. The left common femoral artery is widely patent without hemodynamically significant narrowing. The imaged portions of the bilateral deep and superficial femoral arteries are widely patent without hemodynamically significant narrowing. Veins: The IVC and pelvic venous systems appear patent on this arterial phase examination. Review of the MIP images confirms the above findings. _________________________________________________________ NON-VASCULAR Evaluation of the abdominal organs is largely limited to the arterial phase of enhancement. Hepatobiliary: Normal hepatic contour. There is diffuse decreased attenuation hepatic parenchyma on this postcontrast examination suggestive hepatic steatosis. No discrete hyperenhancing hepatic lesions. Normal appearance of the gallbladder given degree of distention. No radiopaque gallstones. No intra or extrahepatic biliary ductal dilatation. No ascites. Pancreas: Normal appearance of the pancreas. Spleen: Normal appearance of the spleen. Adrenals/Urinary Tract: There is symmetric enhancement of the bilateral kidneys. No evidence of  nephrolithiasis on this postcontrast examination. No discrete renal lesions. No urinary obstruction or perinephric stranding. Normal appearance of the bilateral adrenal glands. Normal appearance of the urinary bladder given degree distention. Stomach/Bowel: Moderate colonic stool burden without evidence of enteric obstruction. Normal appearance of the terminal ileum and the retrocecal appendix. No discrete areas of bowel wall thickening. No pneumoperitoneum, pneumatosis or portal venous gas. Lymphatic: No bulky retroperitoneal,  mesenteric, pelvic or inguinal lymphadenopathy. Reproductive: Note is made of an approximately 2.4 cm heterogeneously enhancing fibroid within the left side of the uterine fundus (coronal image 69, series 8; axial image 171, series 7). Note is made of an approximately 5.4 x 3.6 cm left-sided adnexal cyst (image 171, series 7) with associated adjacent hydrosalpinx (image 160, series 7) similar to remote abdominal CT performed 09/2013. No discrete right-sided adnexal lesions. No free fluid the pelvic cul-de-sac. Other: Tiny mesenteric fat containing Peri umbilical hernia. Musculoskeletal: No acute or aggressive osseous abnormalities. Moderate DDD of L5-S1 with disc space height loss, endplate irregularity and small posteriorly directed disc osteophyte complex at this location. Mild degenerative change the bilateral hips with joint space loss, subchondral sclerosis and a subchondral geode noted within the right femoral head. Review of the MIP images confirms the above findings. IMPRESSION: Chest CTA Impression: 1. No acute cardiopulmonary disease. Specifically, no evidence of thoracic aortic aneurysm or dissection. No evidence of central pulmonary embolism. 2. Borderline cardiomegaly. Further evaluation cardiac echo could performed as indicated. Abdomen and pelvic CTA impression: 1. No acute findings within the abdomen or pelvis. 2. Moderate amount of atherosclerotic plaque within normal caliber  abdominal aorta, not resulting in hemodynamically significant stenosis. Aortic Atherosclerosis (ICD10-I70.0). 3. Fibroid uterus with note made of an approximately 5.4 cm left-sided presumably physiologic adnexal cyst with associated adjacent hydrosalpinx, similar to remote abdominal CT performed 09/2013. By report, patient is scheduled to undergo hysterectomy. 4. Suspected hepatic steatosis.  Correlation with LFTs is advised. Electronically Signed   By: Sandi Mariscal M.D.   On: 11/08/2020 11:41    Subjective: Seen and examined at bedside and states that her chest pain or improved however her blood pressure is still significant elevated.  She did not want to stay for further inpatient work-up and signed out [place].  Discharge Exam: Vitals:   11/09/20 1200 11/09/20 1245  BP: (!) 209/119 (!) 187/101  Pulse: 64 79  Resp: 15 12  Temp:    SpO2: 98% 100%   Vitals:   11/09/20 1014 11/09/20 1100 11/09/20 1200 11/09/20 1245  BP: (!) 207/87 (!) 177/103 (!) 209/119 (!) 187/101  Pulse:  (!) 57 64 79  Resp:  14 15 12   Temp:      TempSrc:      SpO2:  100% 98% 100%  Weight:      Height:        General: Pt is alert, awake, not in acute distress but is a little agitated Cardiovascular: Slightly bradycardic but normal rhythm, S1/S2 +, no rubs, no gallops Respiratory: Diminished bilaterally, no wheezing, no rhonchi Abdominal: Soft, NT, ND, bowel sounds + Extremities: no edema, no cyanosis  The results of significant diagnostics from this hospitalization (including imaging, microbiology, ancillary and laboratory) are listed below for reference.    Microbiology: Recent Results (from the past 240 hour(s))  MRSA PCR Screening     Status: None   Collection Time: 11/08/20  7:32 PM   Specimen: Nasal Mucosa; Nasopharyngeal  Result Value Ref Range Status   MRSA by PCR NEGATIVE NEGATIVE Final    Comment:        The GeneXpert MRSA Assay (FDA approved for NASAL specimens only), is one  component of a comprehensive MRSA colonization surveillance program. It is not intended to diagnose MRSA infection nor to guide or monitor treatment for MRSA infections. Performed at [facility], [place] [address] [place],  [address]      Labs: BNP (  last 3 results) No results for input(s): BNP in the last 8760 hours. Basic Metabolic Panel: Recent Labs  Lab 11/08/20 0943 11/08/20 1159  NA 138  --   K 3.9  --   CL 99  --   CO2 28  --   GLUCOSE 106*  --   BUN 12  --   CREATININE 0.91  --   CALCIUM 9.9  --   MG  --  2.2   Liver Function Tests: No results for input(s): AST, ALT, ALKPHOS, BILITOT, PROT, ALBUMIN in the last 168 hours. No results for input(s): LIPASE, AMYLASE in the last 168 hours. No results for input(s): AMMONIA in the last 168 hours. CBC: Recent Labs  Lab 11/08/20 0943  WBC 12.0*  HGB 16.0*  HCT 47.9*  MCV 94.9  PLT 252   Cardiac Enzymes: No results for input(s): CKTOTAL, CKMB, CKMBINDEX, TROPONINI in the last 168 hours. BNP: Invalid input(s): POCBNP CBG: No results for input(s): GLUCAP in the last 168 hours. D-Dimer Recent Labs    11/08/20 0943  DDIMER 0.77*   Hgb A1c No results for input(s): HGBA1C in the last 72 hours. Lipid Profile No results for input(s): CHOL, HDL, LDLCALC, TRIG, CHOLHDL, LDLDIRECT in the last 72 hours. Thyroid function studies No results for input(s): TSH, T4TOTAL, T3FREE, THYROIDAB in the last 72 hours.  Invalid input(s): FREET3 Anemia work up No results for input(s): VITAMINB12, FOLATE, FERRITIN, TIBC, IRON, RETICCTPCT in the last 72 hours. Urinalysis    Component Value Date/Time   COLORURINE YELLOW 09/21/2013 1121   APPEARANCEUR CLOUDY (A) 09/21/2013 1121   LABSPEC 1.016 09/21/2013 1121   PHURINE 5.0 09/21/2013 1121   GLUCOSEU NEGATIVE 09/21/2013 1121   HGBUR TRACE (A) 09/21/2013 1121   BILIRUBINUR NEGATIVE 09/21/2013 1121   KETONESUR NEGATIVE 09/21/2013 1121   PROTEINUR  NEGATIVE 09/21/2013 1121   UROBILINOGEN 0.2 09/21/2013 1121   NITRITE NEGATIVE 09/21/2013 1121   LEUKOCYTESUR NEGATIVE 09/21/2013 1121   Sepsis Labs Invalid input(s): PROCALCITONIN,  WBC,  LACTICIDVEN Microbiology Recent Results (from the past 240 hour(s))  MRSA PCR Screening     Status: None   Collection Time: 11/08/20  7:32 PM   Specimen: Nasal Mucosa; Nasopharyngeal  Result Value Ref Range Status   MRSA by PCR NEGATIVE NEGATIVE Final    Comment:        The GeneXpert MRSA Assay (FDA approved for NASAL specimens only), is one component of a comprehensive MRSA colonization surveillance program. It is not intended to diagnose MRSA infection nor to guide or monitor treatment for MRSA infections. Performed at [facility], [place] [address] [place], [place] [address]    Time coordinating discharge: 35 minutes  SIGNED:  Kerney Elbe, DO [facility] 11/09/2020, 1:15 PM Pager is on [email or web address]  If 7PM-7AM, please contact night-coverage [email or web address]

## 2020-11-09 NOTE — Consult Note (Addendum)
Cardiology Consultation:   Patient ID: Laura Garrett MRN: [id number]; DOB: [birth year]  Admit date: 11/08/2020 Date of Consult: 11/09/2020  PCP:  Leonides Sake, MD   [facility] Providers Cardiologist:  Jenean Lindau, MD      Patient Profile:   Laura Garrett is a 50 y.o. female with a hx of anxiety, bipolar disorder, chronic back pain, current smoker, polysubstance abuse, uncontrolled HTN, and CAD with PCI to RCA in 2011, and renal artery stenting who is being seen 11/09/2020 for the evaluation of chest pain in the setting of hypertensive urgency at the request of Dr. Alfredia Ferguson.  History of Present Illness:   Ms. Cathell has known CAD with RCA stent placed in 2011. She reportedly has had bilateral renal artery stents in 2012, not seen on angiogram 12/2010. She has a history of poorly controlled hypertension. She feels poorly if SBP is below 120. 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 She has recently re-established with Dr. Geraldo Pitter in Feb 2022. She was started on 81 mg ASA and toprol 50 mg, later started on 25 mg HCTZ.   She presented to [facility] with chest pain x 2 days. She was hypertensive to 245/120 on arrival. CTA negative for PE and dissection. She was started on nitro gtt and cardene gtt for BP control. BP did come down and she had a reduction in chest pain.   HS troponin x 2 negative HCG 8.5 D-dimer 0.77  Echocardiogram yesterday with EF 60-65%, no RWMA, and moderate LVH.   Pt refused to be tested for COVID-19 and is not vaccinated. She was therefore admitted to the [place] unit. Cardiology asked to evaluate for ongoing chest pain. BP is improved and nitro and cardene gtts were discontinued.  She is now on 10 mg amlodipine, 25 mg hydralazine TID, 25 mg HCTZ, 100 mg toprol. BP running in the 130-170s   I was able to speak with her by patient phone. She is very frustrated with being in the hospital. She thought  her chest pain was due to red dye in a red slushie she drank causing heart burn. CP persisted and felt like a pressure that radiated to her shoulder blades. She came to [facility] to get PT equipment for her father and decided to come to the ER to check her BP. No further chest pain with improvement in her pressure. She states her "heart is fine" and her 'kidneys don't need a stent." She has no complaints right now and wishes to discharge.   Past Medical History:  Diagnosis Date  . Abdominal aortic atherosclerosis ([place])   . Anxiety   . Atherosclerosis of renal artery ([place]) 10/13/2013  . Bipolar depression ([place]) 10/16/2016  . CAD (coronary artery disease)    a. MI/RCA stent 04/2010 in [place].  . Chronic back pain    Associated with occasional leg numbness  . Cigarette smoker 03/23/2016  . Dyshidrotic eczema   . History of kidney stones   . History of stent insertion of renal artery 03/23/2016  . HTN (hypertension) 12/29/2010  . Hyperlipidemia 12/29/2010  . Insomnia 10/16/2016  . Leg numbness   . Lymphadenopathy, inguinal    RLE lymphadenopathy on PV dopplers 12/2010  . Myocardial infarct [facility]) 2011   Stent to RCA  . Pelvic pain 10/16/2016  . Peripheral vascular disease ([place])   . Renal artery stenosis ([place]) 12/29/2010  . Renal artery stenosis of unknown cause (  [place])    Reported bilateral renal artery stents in 2012, not seen on angiogram 12/2010  . Tobacco use disorder     Past Surgical History:  Procedure Laterality Date  . CORONARY STENT PLACEMENT  2011  . KIDNEY STENT PLACEMENT Bilateral 07/2010   Renal artery stents     Home Medications:  Prior to Admission medications   Medication Sig Start Date End Date Taking? Authorizing Provider  gabapentin (NEURONTIN) 600 MG tablet Take 600 mg by mouth 3 (three) times daily. 07/19/20  Yes [provider]  hydrochlorothiazide (HYDRODIURIL) 25 MG tablet Take 1 tablet (25 mg total) by mouth daily. 09/16/20 12/15/20 Yes Revankar, Reita Cliche, MD   ibuprofen (ADVIL) 200 MG tablet Take 200 mg by mouth every 6 (six) hours as needed for mild pain.   Yes [provider]  meloxicam (MOBIC) 15 MG tablet Take 15 mg by mouth daily.   Yes [provider]  metoprolol succinate (TOPROL-XL) 100 MG 24 hr tablet Take 100 mg by mouth daily. 08/16/20  Yes [provider]  nitroGLYCERIN (NITROSTAT) 0.4 MG SL tablet Place 0.4 mg under the tongue every 5 (five) minutes as needed for chest pain.   Yes [provider]  rosuvastatin (CRESTOR) 20 MG tablet Take 20 mg by mouth at bedtime. 07/20/20  Yes [provider]    Inpatient Medications: Scheduled Meds: . amLODipine  10 mg Oral Daily  . Chlorhexidine Gluconate Cloth  6 each Topical Daily  . gabapentin  600 mg Oral TID  . hydrALAZINE  25 mg Oral Q8H  . hydrochlorothiazide  25 mg Oral Daily  . metoprolol succinate  100 mg Oral Daily  . rosuvastatin  20 mg Oral QHS   Continuous Infusions: . niCARDipine Stopped (11/09/20 0449)  . nitroGLYCERIN Stopped (11/09/20 0016)   PRN Meds: acetaminophen **OR** acetaminophen, labetalol, ondansetron **OR** ondansetron (ZOFRAN) IV  Allergies:    Allergies  Allergen Reactions  . Latex     States "I think I'm allergic to latex"    Social History:   Social History   Socioeconomic History  . Marital status: Single    Spouse name: Not on file  . Number of children: Not on file  . Years of education: Not on file  . Highest education level: Not on file  Occupational History  . Not on file  Tobacco Use  . Smoking status: Current Every Day Smoker    Years: 15.00    Types: Cigarettes    Last attempt to quit: 07/16/2016    Years since quitting: 4.3  . Smokeless tobacco: Never Used  Substance and Sexual Activity  . Alcohol use: Yes    Alcohol/week: 7.0 - 14.0 standard drinks    Types: 7 - 14 Cans of beer per week    Comment: 1-2 beers once-twice week  . Drug use: Yes    Types: Marijuana    Comment: Daily  marijuana  . Sexual activity: Yes  Other Topics Concern  . Not on file  Social History Narrative  . Not on file   Social Determinants of Health   Financial Resource Strain: Not on file  Food Insecurity: Not on file  Transportation Needs: Not on file  Physical Activity: Not on file  Stress: Not on file  Social Connections: Not on file  Intimate Partner Violence: Not on file    Family History:    Family History  Problem Relation Age of Onset  . Hypertension Mother   . Hypertension Brother   .  Hypertension Paternal Grandmother   . Hyperlipidemia Paternal Grandmother   . Stroke Paternal Grandmother   . Kidney disease Paternal Grandmother   . CAD Paternal Grandmother        stent and pacemaker  . Breast cancer Maternal Aunt   . Prostate cancer Father   . Hypertension Father   . Breast cancer Maternal Grandmother      ROS:  Please see the history of present illness.   All other ROS reviewed and negative.     Physical Exam/Data:   Vitals:   11/09/20 0615 11/09/20 0700 11/09/20 0800 11/09/20 1014  BP: (!) 121/104 (!) 143/65 137/75 (!) 207/87  Pulse: 63 61 (!) 52   Resp: 20 15 14    Temp:   98.2 F (36.8 C)   TempSrc:   Oral   SpO2: 100% 100% 100%   Weight:      Height:        Intake/Output Summary (Last 24 hours) at 11/09/2020 1105 Last data filed at 11/09/2020 0900 Gross per 24 hour  Intake 963.15 ml  Output --  Net 963.15 ml   Last 3 Weights 11/08/2020 09/16/2020 07/22/2020  Weight (lbs) 140 lb 144 lb 141 lb  Weight (kg) 63.504 kg 65.318 kg 63.957 kg     Body mass index is 22.6 kg/m.  General:  NAD Lungs: respirations unlabored Neuro:  No focal abnormalities by phone Psych:  frusrated   EKG:  The EKG was personally reviewed and demonstrates:  pending  Telemetry:  Telemetry was personally reviewed and demonstrates:  Sinus rhythm with HR in the 60s  Relevant CV Studies:  Echo 11/08/20: 1. Left ventricular ejection fraction, by estimation, is 60 to 65%. The   left ventricle has normal function. The left ventricle has no regional  wall motion abnormalities. There is moderate left ventricular hypertrophy.  Left ventricular diastolic  parameters are indeterminate.  2. Right ventricular systolic function is normal. The right ventricular  size is normal. Tricuspid regurgitation signal is inadequate for assessing  PA pressure.  3. The mitral valve is normal in structure. No evidence of mitral valve  regurgitation.  4. The aortic valve is tricuspid. Aortic valve regurgitation is mild. No  aortic stenosis is present.   Laboratory Data:  High Sensitivity Troponin:   Recent Labs  Lab 11/08/20 0943 11/08/20 1159  TROPONINIHS 4 4     Chemistry Recent Labs  Lab 11/08/20 0943  NA 138  K 3.9  CL 99  CO2 28  GLUCOSE 106*  BUN 12  CREATININE 0.91  CALCIUM 9.9  GFRNONAA >60  ANIONGAP 11    No results for input(s): PROT, ALBUMIN, AST, ALT, ALKPHOS, BILITOT in the last 168 hours. Hematology Recent Labs  Lab 11/08/20 0943  WBC 12.0*  RBC 5.05  HGB 16.0*  HCT 47.9*  MCV 94.9  MCH 31.7  MCHC 33.4  RDW 13.6  PLT 252   BNPNo results for input(s): BNP, PROBNP in the last 168 hours.  DDimer  Recent Labs  Lab 11/08/20 0943  DDIMER 0.77*     Radiology/Studies:  DG Chest 2 View  Result Date: 11/08/2020 CLINICAL DATA:  Chest pain and shortness of breath. EXAM: CHEST - 2 VIEW COMPARISON:  12/02/2015 FINDINGS: The lungs are clear without focal pneumonia, edema, pneumothorax or pleural effusion. The cardiopericardial silhouette is within normal limits for size. The visualized bony structures of the thorax show no acute abnormality. IMPRESSION: No active cardiopulmonary disease. Electronically Signed   By: Verda Cumins.D.  On: 11/08/2020 10:04   ECHOCARDIOGRAM COMPLETE  Result Date: 11/08/2020    ECHOCARDIOGRAM REPORT   Patient Name:   VIRGIN ZELLERS Date of Exam: 11/08/2020 Medical Rec #:  [id number]           Height:        66.0 in Accession #:    [id number]          Weight:       140.0 lb Date of Birth:  1970/08/02           BSA:          1.719 m Patient Age:    55 years            BP:           133/91 mmHg Patient Gender: F                   HR:           53 bpm. Exam Location:  Inpatient Procedure: 2D Echo Indications:     Chest Pain R07.9  History:         Patient has prior history of Echocardiogram examinations, most                  recent 08/23/2020. CAD and Previous Myocardial Infarction; Risk                  Factors:Hypertension, Dyslipidemia and Current Smoker. MI/RCA                  stent 04/2010.  Sonographer:     Leavy Cella Referring Phys:  [id number] RAVI PAHWANI Diagnosing Phys: Oswaldo Milian MD IMPRESSIONS  1. Left ventricular ejection fraction, by estimation, is 60 to 65%. The left ventricle has normal function. The left ventricle has no regional wall motion abnormalities. There is moderate left ventricular hypertrophy. Left ventricular diastolic parameters are indeterminate.  2. Right ventricular systolic function is normal. The right ventricular size is normal. Tricuspid regurgitation signal is inadequate for assessing PA pressure.  3. The mitral valve is normal in structure. No evidence of mitral valve regurgitation.  4. The aortic valve is tricuspid. Aortic valve regurgitation is mild. No aortic stenosis is present. FINDINGS  Left Ventricle: Left ventricular ejection fraction, by estimation, is 60 to 65%. The left ventricle has normal function. The left ventricle has no regional wall motion abnormalities. The left ventricular internal cavity size was normal in size. There is  moderate left ventricular hypertrophy. Left ventricular diastolic parameters are indeterminate. Right Ventricle: The right ventricular size is normal. No increase in right ventricular wall thickness. Right ventricular systolic function is normal. Tricuspid regurgitation signal is inadequate for assessing PA pressure. Left Atrium:  Left atrial size was normal in size. Right Atrium: Right atrial size was normal in size. Pericardium: There is no evidence of pericardial effusion. Mitral Valve: The mitral valve is normal in structure. No evidence of mitral valve regurgitation. Tricuspid Valve: The tricuspid valve is normal in structure. Tricuspid valve regurgitation is not demonstrated. Aortic Valve: The aortic valve is tricuspid. Aortic valve regurgitation is mild. Aortic regurgitation PHT measures 860 msec. No aortic stenosis is present. Pulmonic Valve: The pulmonic valve was not well visualized. Pulmonic valve regurgitation is not visualized. Aorta: The aortic root and ascending aorta are structurally normal, with no evidence of dilitation. IAS/Shunts: The interatrial septum was not well visualized.  LEFT VENTRICLE PLAX 2D LVIDd:         4.25 cm  Diastology LVIDs:         2.35 cm LV e' medial:    5.11 cm/s LV PW:         1.39 cm LV E/e' medial:  7.3 LV IVS:        1.31 cm LV e' lateral:   6.08 cm/s                        LV E/e' lateral: 6.1  RIGHT VENTRICLE RV S prime:     13.40 cm/s TAPSE (M-mode): 2.6 cm LEFT ATRIUM             Index       RIGHT ATRIUM          Index LA diam:        3.30 cm 1.92 cm/m  RA Area:     9.31 cm LA Vol (A2C):   45.7 ml 26.59 ml/m RA Volume:   17.40 ml 10.12 ml/m LA Vol (A4C):   30.6 ml 17.81 ml/m LA Biplane Vol: 38.4 ml 22.34 ml/m  AORTIC VALVE AI PHT:      860 msec  AORTA Ao Root diam: 2.90 cm MITRAL VALVE MV Area (PHT): 3.12 cm MV Decel Time: 243 msec MV E velocity: 37.20 cm/s MV A velocity: 36.60 cm/s MV E/A ratio:  1.02 Oswaldo Milian MD Electronically signed by Oswaldo Milian MD Signature Date/Time: 11/08/2020/6:41:46 PM    Final (Updated)    CT Angio Chest/Abd/Pel for Dissection W and/or Wo Contrast  Result Date: 11/08/2020 CLINICAL DATA:  Evaluate for aortic dissection. EXAM: CT ANGIOGRAPHY CHEST, ABDOMEN AND PELVIS TECHNIQUE: Non-contrast CT of the chest was initially obtained.  Multidetector CT imaging through the chest, abdomen and pelvis was performed using the standard protocol during bolus administration of intravenous contrast. Multiplanar reconstructed images and MIPs were obtained and reviewed to evaluate the vascular anatomy. CONTRAST:  187mL OMNIPAQUE IOHEXOL 350 MG/ML SOLN COMPARISON:  CT abdomen pelvis-12/13/2016; 09/23/2013; pelvic ultrasound-01/04/2017 FINDINGS: CTA CHEST FINDINGS Vascular Findings: No evidence of thoracic aortic aneurysm or dissection on this nongated examination. There is a minimal amount of predominantly noncalcified atherosclerotic plaque involving the descending thoracic aorta, not resulting in a hemodynamically significant stenosis. Note is made of a small ductus diverticulum. No periaortic stranding. Review of the noncontrast images is negative for the presence of an intramural hematoma. Conventional configuration of the aortic arch. The branch vessels of the aortic arch appear widely patent throughout their imaged courses. Borderline cardiomegaly.  No pericardial effusion. Although this examination was not tailored for the evaluation the pulmonary arteries, there are no discrete filling defects within the central pulmonary arterial tree to suggest central pulmonary embolism. Normal caliber of the main pulmonary artery. ------------------------------------------------------------- Thoracic aortic measurements: SINOTUBULAR JUNCTION: 27 mm as measured in greatest oblique short axis coronal dimension. PROXIMAL ASCENDING THORACIC AORTA: 31 mm as measured in greatest oblique short axis axial dimension at the level of the main pulmonary artery and approximately 30 mm in greatest oblique short axis coronal diameter (coronal image 45, series 8) AORTIC ARCH: 27 mm as measured in greatest oblique short axis sagittal dimension. PROXIMAL DESCENDING THORACIC AORTA: 24 mm as measured in greatest oblique short axis axial dimension at the level of the main pulmonary  artery. DISTAL DESCENDING THORACIC AORTA: 19 mm as measured in greatest oblique short axis axial dimension at the level of the diaphragmatic hiatus. Review of the MIP images confirms the above findings. ------------------------------------------------------------- Non-Vascular Findings: Mediastinum/Lymph Nodes: No bulky mediastinal,  hilar or axillary lymphadenopathy. Lungs/Pleura: Minimal dependent subpleural ground-glass atelectasis, left greater than right, most conspicuously involving the inferior segment of the lingula. No discrete focal airspace opacities. No pleural effusion or pneumothorax. The central pulmonary airways appear widely patent. No discrete pulmonary nodules. Musculoskeletal: No acute or aggressive osseous abnormalities. Regional soft tissues appear normal. Mild heterogeneity of the thyroid gland without discrete nodule. _________________________________________________________ _________________________________________________________ CTA ABDOMEN AND PELVIS FINDINGS VASCULAR Aorta: Moderate amount of mixed calcified and noncalcified atherosclerotic plaque within normal caliber abdominal aorta, not resulting in hemodynamically significant stenosis. No evidence of abdominal aortic dissection or periaortic stranding. Celiac: There is a minimal amount of noncalcified atherosclerotic plaque involving the superior aspect of the proximal celiac trunk (sagittal image 92, series 10, potentially accentuated due to mass effect from the crus of the diaphragm and not resulting in hemodynamically significant stenosis. Conventional branching pattern. SMA: Widely patent without hemodynamically significant narrowing. Conventional branching pattern. The distal tributaries the SMA appear widely patent without discrete intraluminal filling defect to suggest distal embolism. Renals: Solitary bilaterally; the bilateral renal arteries are widely patent without hemodynamically significant narrowing. No vessel  irregularity to suggest FMD. IMA: Remains patent. Inflow: There is a moderate amount of mixed calcified and noncalcified atherosclerotic plaque involving the bilateral common iliac arteries, left greater than right, not resulting in a hemodynamically significant stenosis. The bilateral external and internal iliac arteries are of normal caliber and patent without hemodynamically significant narrowing. Proximal outflow: There is a minimal amount of eccentric mixed calcified and noncalcified atherosclerotic plaque involving the right common femoral artery, not resulting in hemodynamically significant stenosis. The left common femoral artery is widely patent without hemodynamically significant narrowing. The imaged portions of the bilateral deep and superficial femoral arteries are widely patent without hemodynamically significant narrowing. Veins: The IVC and pelvic venous systems appear patent on this arterial phase examination. Review of the MIP images confirms the above findings. _________________________________________________________ NON-VASCULAR Evaluation of the abdominal organs is largely limited to the arterial phase of enhancement. Hepatobiliary: Normal hepatic contour. There is diffuse decreased attenuation hepatic parenchyma on this postcontrast examination suggestive hepatic steatosis. No discrete hyperenhancing hepatic lesions. Normal appearance of the gallbladder given degree of distention. No radiopaque gallstones. No intra or extrahepatic biliary ductal dilatation. No ascites. Pancreas: Normal appearance of the pancreas. Spleen: Normal appearance of the spleen. Adrenals/Urinary Tract: There is symmetric enhancement of the bilateral kidneys. No evidence of nephrolithiasis on this postcontrast examination. No discrete renal lesions. No urinary obstruction or perinephric stranding. Normal appearance of the bilateral adrenal glands. Normal appearance of the urinary bladder given degree distention.  Stomach/Bowel: Moderate colonic stool burden without evidence of enteric obstruction. Normal appearance of the terminal ileum and the retrocecal appendix. No discrete areas of bowel wall thickening. No pneumoperitoneum, pneumatosis or portal venous gas. Lymphatic: No bulky retroperitoneal, mesenteric, pelvic or inguinal lymphadenopathy. Reproductive: Note is made of an approximately 2.4 cm heterogeneously enhancing fibroid within the left side of the uterine fundus (coronal image 69, series 8; axial image 171, series 7). Note is made of an approximately 5.4 x 3.6 cm left-sided adnexal cyst (image 171, series 7) with associated adjacent hydrosalpinx (image 160, series 7) similar to remote abdominal CT performed 09/2013. No discrete right-sided adnexal lesions. No free fluid the pelvic cul-de-sac. Other: Tiny mesenteric fat containing Peri umbilical hernia. Musculoskeletal: No acute or aggressive osseous abnormalities. Moderate DDD of L5-S1 with disc space height loss, endplate irregularity and small posteriorly directed disc osteophyte complex at this location. Mild degenerative change the  bilateral hips with joint space loss, subchondral sclerosis and a subchondral geode noted within the right femoral head. Review of the MIP images confirms the above findings. IMPRESSION: Chest CTA Impression: 1. No acute cardiopulmonary disease. Specifically, no evidence of thoracic aortic aneurysm or dissection. No evidence of central pulmonary embolism. 2. Borderline cardiomegaly. Further evaluation cardiac echo could performed as indicated. Abdomen and pelvic CTA impression: 1. No acute findings within the abdomen or pelvis. 2. Moderate amount of atherosclerotic plaque within normal caliber abdominal aorta, not resulting in hemodynamically significant stenosis. Aortic Atherosclerosis (ICD10-I70.0). 3. Fibroid uterus with note made of an approximately 5.4 cm left-sided presumably physiologic adnexal cyst with associated adjacent  hydrosalpinx, similar to remote abdominal CT performed 09/2013. By report, patient is scheduled to undergo hysterectomy. 4. Suspected hepatic steatosis.  Correlation with LFTs is advised. Electronically Signed   By: Sandi Mariscal M.D.   On: 11/08/2020 11:41     Assessment and Plan:   Chest pain CAD s/p RCA stenting 2011 - hs troponin x 2 negative - EKG pending - suspect her chest pain was related to her significant hypertension on arrival - chest pain resolved as pressure reduced - echo with normal EF and no RWMA - will defer ischemic evaluation for now - focus on risk factor management   Hypertensive emergency Poorly controlled hypertension - presenting pressure was 245/110 - started on nitro and cardene gtts - on 10 mg amlodipine, 25 mg hydralazine ID, 25 mg HCTZ, and 100 mg toprol - HR in the 60s prior to toprol - reduce toprol to 25 mg - increase hydralazine to 75 mg TID   PAD - recent renal artery duplex - per [facility]   Current smoker - knows she needs to quit    Risk Assessment/Risk Scores:   HEAR Score (for undifferentiated chest pain):  HEAR Score: 3    For questions or updates, please contact [place] Please consult [email or web address] for contact info under    Signed, Ledora Bottcher, PA  11/09/2020 11:05 AM   History and all data above reviewed.  Patient examined.  I agree with the findings as above.  Patient has a history of difficult to control hypertension.  Renal artery stenosis in the past stented.  She says her blood pressure was controlled until "the stents came out."  She has had evidence of patent renal arteries on Doppler in 2018.  She says her blood pressure has not been well controlled recently and she stopped taking it.  She actually presented with chest discomfort that was a burning discomfort after she drank a slushy.  She was noted to have hypertensive urgency.  She is very agitated and wants to leave and her blood pressures been continued to be  difficult to control.  She says she does well at home.  She exercises routinely.  She goes to her she shed.  She does not have chest pressure, neck or arm discomfort.  She does not have shortness of breath, PND or orthopnea.   Her EKG did not demonstrate acute changes.  There was LVH with repolarization.  Troponin was negative.  The patient exam reveals COR:RRR  ,  Lungs: Clear  ,  Abd: Positive bowel sounds, no rebound no guarding, Ext No edema  .  All available labs, radiology testing, previous records reviewed. Agree with documented assessment and plan. She likely is going to leave AMA.    HTN:  I will try to set her up with Dr. Skeet Latch in her  [facility].  This could be remote. I think her meds as currently on the [facility] are very reasonable for a new BP regimen.  However, it will likely need adjustment ongoing.  Chest pain:  Non anginal and no objective evidence of ischemia.  No further work up.     Jeneen Rinks Day Deery  12:54 PM  11/09/2020

## 2020-11-18 ENCOUNTER — Encounter: Payer: Self-pay | Admitting: Cardiology

## 2020-11-18 ENCOUNTER — Other Ambulatory Visit: Payer: Self-pay

## 2020-11-18 ENCOUNTER — Ambulatory Visit (INDEPENDENT_AMBULATORY_CARE_PROVIDER_SITE_OTHER): Admitting: Cardiology

## 2020-11-18 VITALS — BP 150/110 | HR 58 | Ht 66.0 in | Wt 137.2 lb

## 2020-11-18 DIAGNOSIS — I2510 Atherosclerotic heart disease of native coronary artery without angina pectoris: Secondary | ICD-10-CM

## 2020-11-18 DIAGNOSIS — F17210 Nicotine dependence, cigarettes, uncomplicated: Secondary | ICD-10-CM | POA: Diagnosis not present

## 2020-11-18 DIAGNOSIS — E782 Mixed hyperlipidemia: Secondary | ICD-10-CM

## 2020-11-18 DIAGNOSIS — I10 Essential (primary) hypertension: Secondary | ICD-10-CM

## 2020-11-18 NOTE — Patient Instructions (Signed)
Medication Instructions:  No medication changes. *If you need a refill on your cardiac medications before your next appointment, please call your pharmacy*   Lab Work: Your physician recommends that you return for lab work in: 1 week for a BMET. You can come Monday through Friday 8:30 am to 12:00 pm and 1:15 to 4:30. You do not need to make an appointment as the order has already been placed.   If you have labs (blood work) drawn today and your tests are completely normal, you will receive your results only by: [place] (if you have MyChart) OR A paper copy in the mail If you have any lab test that is abnormal or we need to change your treatment, we will call you to review the results.   Testing/Procedures: None ordered   Follow-Up: At [facility], you and your health needs are our priority.  As part of our continuing mission to provide you with exceptional heart care, we have created designated Provider Care Teams.  These Care Teams include your primary Cardiologist (physician) and Advanced Practice Providers (APPs -  Physician Assistants and Nurse Practitioners) who all work together to provide you with the care you need, when you need it.  We recommend signing up for the patient portal called "MyChart".  Sign up information is provided on this After Visit Summary.  MyChart is used to connect with patients for Virtual Visits (Telemedicine).  Patients are able to view lab/test results, encounter notes, upcoming appointments, etc.  Non-urgent messages can be sent to your provider as well.   To learn more about what you can do with MyChart, go to [email or web address].    Your next appointment:   6 month(s)  The format for your next appointment:   In Person  Provider:   Jyl Heinz, MD   Other Instructions Blood Pressure Record Sheet To take your blood pressure, you will need a blood pressure machine. You can buy a blood pressure machine (blood pressure monitor) at  your clinic, drug store, or online. When choosing one, consider: An automatic monitor that has an arm cuff. A cuff that wraps snugly around your upper arm. You should be able to fit only one finger between your arm and the cuff. A device that stores blood pressure reading results. Do not choose a monitor that measures your blood pressure from your wrist or finger. Follow your health care provider's instructions for how to take your blood pressure. To use this form: Get one reading in the morning (a.m.) 1-2 hours after you take any medicines. Get one reading in the evening (p.m.) before supper. Take at least 2 readings with each blood pressure check. This makes sure the results are correct. Wait 1-2 minutes between measurements. Write down the results in the spaces on this form. Repeat this once a week, or as told by your health care provider.  Make a follow-up appointment with your health care provider to discuss the results. Blood pressure log Date: _______________________ a.m. _____________________(1st reading) _____________________(2nd reading) p.m. _____________________(1st reading) _____________________(2nd reading) Date: _______________________ a.m. _____________________(1st reading) _____________________(2nd reading) p.m. _____________________(1st reading) _____________________(2nd reading) Date: _______________________ a.m. _____________________(1st reading) _____________________(2nd reading) p.m. _____________________(1st reading) _____________________(2nd reading) Date: _______________________ a.m. _____________________(1st reading) _____________________(2nd reading) p.m. _____________________(1st reading) _____________________(2nd reading) Date: _______________________ a.m. _____________________(1st reading) _____________________(2nd reading) p.m. _____________________(1st reading) _____________________(2nd reading) This information is not intended to replace advice given to  you by your health care provider. Make sure you discuss any questions you have with your health care provider. Document  Revised: 09/16/2019 Document Reviewed: 09/16/2019 Elsevier Patient Education  [place].

## 2020-11-18 NOTE — Progress Notes (Signed)
Cardiology Office Note:    Date:  11/18/2020   ID:  Laura Garrett, DOB 27-Jul-1970, MRN [id number]  PCP:  Leonides Sake, MD  Cardiologist:  Jenean Lindau, MD   Referring MD: Leonides Sake, MD    ASSESSMENT:    No diagnosis found. PLAN:    In order of problems listed above:  Coronary artery disease: Secondary prevention stressed with the patient.  Importance of compliance with diet medication stressed and she vocalized understanding. Essential hypertension: Blood pressure is elevated but she has not taken any of her medicines for a few weeks.  She tells me that she is homeless now.  She finally has got her medications today and she will start taking them.  She will be back in 1 week for Chem-7 and will bring her blood pressure log.  Salt intake issues, diet and lifestyle modification was urged and she promises to do better. Mixed dyslipidemia: Diet was emphasized.  Lipids reviewed.  This is followed by primary care. Cigarette smoker: Quitting was emphasized and she promises to comply Patient will be seen in follow-up appointment in 3 months or earlier if the patient has any concerns    Medication Adjustments/Labs and Tests Ordered: Current medicines are reviewed at length with the patient today.  Concerns regarding medicines are outlined above.  No orders of the defined types were placed in this encounter.  No orders of the defined types were placed in this encounter.    No chief complaint on file.    History of Present Illness:    Laura Garrett is a 50 y.o. female.  Patient has past medical history of coronary artery disease, essential hypertension, dyslipidemia.  She recently went to the hospital with elevated blood pressure and signed out AMA.  I reviewed those records.  Unfortunately she continues to smoke.  At the time of my evaluation, the patient is alert awake oriented and in no distress.  Past Medical History:  Diagnosis Date   Abdominal aortic  atherosclerosis ([place])    Anxiety    Bipolar depression ([place]) 10/16/2016   CAD (coronary artery disease)    a. MI/RCA stent 04/2010 in [place].   Chronic back pain    Associated with occasional leg numbness   Cigarette smoker 03/23/2016   Dyshidrotic eczema    History of kidney stones    HTN (hypertension) 12/29/2010   Hyperlipidemia 12/29/2010   Insomnia 10/16/2016   Lymphadenopathy, inguinal    RLE lymphadenopathy on PV dopplers 12/2010   Peripheral vascular disease ([place])    Renal artery stenosis of unknown cause ([place])    Reported bilateral renal artery stents in 2012, not seen on angiogram 12/2010   Tobacco use disorder     Past Surgical History:  Procedure Laterality Date   CORONARY STENT PLACEMENT  2011   KIDNEY STENT PLACEMENT Bilateral 07/2010   Renal artery stents    Current Medications: Current Meds  Medication Sig   gabapentin (NEURONTIN) 600 MG tablet Take 600 mg by mouth 3 (three) times daily.   hydrochlorothiazide (HYDRODIURIL) 25 MG tablet Take 1 tablet (25 mg total) by mouth daily.   nitroGLYCERIN (NITROSTAT) 0.4 MG SL tablet Place 0.4 mg under the tongue every 5 (five) minutes as needed for chest pain.   rosuvastatin (CRESTOR) 20 MG tablet Take 20 mg by mouth at bedtime.     Allergies:   Latex   Social History   Socioeconomic History   Marital status: Single    Spouse  name: Not on file   Number of children: Not on file   Years of education: Not on file   Highest education level: Not on file  Occupational History   Not on file  Tobacco Use   Smoking status: Every Day    Years: 15.00    Pack years: 0.00    Types: Cigarettes    Last attempt to quit: 07/16/2016    Years since quitting: 4.3   Smokeless tobacco: Never  Substance and Sexual Activity   Alcohol use: Yes    Alcohol/week: 7.0 - 14.0 standard drinks    Types: 7 - 14 Cans of beer per week    Comment: 1-2 beers once-twice week   Drug use: Yes    Types: Marijuana    Comment: Daily marijuana    Sexual activity: Yes  Other Topics Concern   Not on file  Social History Narrative   Not on file   Social Determinants of Health   Financial Resource Strain: Not on file  Food Insecurity: Not on file  Transportation Needs: Not on file  Physical Activity: Not on file  Stress: Not on file  Social Connections: Not on file     Family History: The patient's family history includes Breast cancer in her maternal aunt and maternal grandmother; CAD in her paternal grandmother; Hyperlipidemia in her paternal grandmother; Hypertension in her brother, father, mother, and paternal grandmother; Kidney disease in her paternal grandmother; Prostate cancer in her father; Stroke in her paternal grandmother.  ROS:   Please see the history of present illness.    All other systems reviewed and are negative.  EKGs/Labs/Other Studies Reviewed:    The following studies were reviewed today: I discussed my findings with the patient at length   Recent Labs: 11/08/2020: BUN 12; Creatinine, Ser 0.91; Hemoglobin 16.0; Magnesium 2.2; Platelets 252; Potassium 3.9; Sodium 138  Recent Lipid Panel    Component Value Date/Time   CHOL 158 10/16/2016 0921   TRIG 135 10/16/2016 0921   HDL 44 10/16/2016 0921   CHOLHDL 3.6 10/16/2016 0921   LDLCALC 87 10/16/2016 0921    Physical Exam:    VS:  BP (!) 150/110   Pulse (!) 58   Ht 5\' 6"  (1.676 m)   Wt 137 lb 3.2 oz (62.2 kg)   LMP 11/01/2020   SpO2 99%   BMI 22.14 kg/m     Wt Readings from Last 3 Encounters:  11/18/20 137 lb 3.2 oz (62.2 kg)  11/08/20 140 lb (63.5 kg)  09/16/20 144 lb (65.3 kg)     GEN: Patient is in no acute distress HEENT: Normal NECK: No JVD; No carotid bruits LYMPHATICS: No lymphadenopathy CARDIAC: Hear sounds regular, 2/6 systolic murmur at the apex. RESPIRATORY:  Clear to auscultation without rales, wheezing or rhonchi  ABDOMEN: Soft, non-tender, non-distended MUSCULOSKELETAL:  No edema; No deformity  SKIN: Warm and  dry NEUROLOGIC:  Alert and oriented x 3 PSYCHIATRIC:  Normal affect   Signed, Jenean Lindau, MD  11/18/2020 1:25 PM    [facility]

## 2021-09-01 ENCOUNTER — Other Ambulatory Visit: Payer: Self-pay

## 2021-09-01 ENCOUNTER — Ambulatory Visit (INDEPENDENT_AMBULATORY_CARE_PROVIDER_SITE_OTHER): Admitting: Cardiology

## 2021-09-01 ENCOUNTER — Encounter: Payer: Self-pay | Admitting: Cardiology

## 2021-09-01 VITALS — BP 152/96 | HR 74 | Ht 66.6 in | Wt 142.4 lb

## 2021-09-01 DIAGNOSIS — I10 Essential (primary) hypertension: Secondary | ICD-10-CM | POA: Diagnosis not present

## 2021-09-01 DIAGNOSIS — I701 Atherosclerosis of renal artery: Secondary | ICD-10-CM

## 2021-09-01 DIAGNOSIS — F17200 Nicotine dependence, unspecified, uncomplicated: Secondary | ICD-10-CM | POA: Diagnosis not present

## 2021-09-01 DIAGNOSIS — F17210 Nicotine dependence, cigarettes, uncomplicated: Secondary | ICD-10-CM | POA: Diagnosis not present

## 2021-09-01 MED ORDER — NITROGLYCERIN 0.4 MG SL SUBL: 0.4000 mg | SUBLINGUAL_TABLET | SUBLINGUAL | 11 refills | Status: AC | PRN

## 2021-09-01 NOTE — Progress Notes (Signed)
?Cardiology Office Note:   ? ?Date:  09/01/2021  ? ?ID:  Laura Garrett, DOB 02-13-1971, MRN [id number] ? ?PCP:  Leonides Sake, MD  ?Cardiologist:  Jenean Lindau, MD  ? ?Referring MD: Leonides Sake, MD  ? ? ?ASSESSMENT:   ? ?1. Primary hypertension   ?2. Atherosclerosis of renal artery ([place])   ?3. Tobacco use disorder   ?4. Cigarette smoker   ? ?PLAN:   ? ?In order of problems listed above: ? ?Coronary artery disease and atherosclerotic vascular disease: Secondary prevention stressed with the patient.  Importance of compliance with diet medication stressed and she vocalized understanding.  She was advised to walk at least half an hour a day 5 days a week and she promises to do so. ?Renal artery stenosis: By ultrasound in 2018 we will recheck this today. ?Essential hypertension: Elevated blood pressure.  She tells me that her blood pressures are better at home and she will get me a blood pressure log and I will review this.  Diet emphasized.  Salt intake issues were discussed. ?Mixed dyslipidemia: Lipids reviewed she will have blood work in the next few days by primary care and send [place] a copy. ?Cigarette smoking: She quit 2 to 3 months ago and I congratulated her about this.  She promises never to go back.Patient will be seen in follow-up appointment in 6 months or earlier if the patient has any concerns ? ? ? ?Medication Adjustments/Labs and Tests Ordered: ?Current medicines are reviewed at length with the patient today.  Concerns regarding medicines are outlined above.  ?No orders of the defined types were placed in this encounter. ? ?No orders of the defined types were placed in this encounter. ? ? ? ?No chief complaint on file. ?  ? ?History of Present Illness:   ? ?Laura Garrett is a 51 y.o. female.  Patient has past medical history of abdominal aortic atherosclerosis, nonobstructive stenosis of the renal artery, coronary artery disease, essential hypertension, dyslipidemia and cigarette  smoking.  Fortunately she is quit smoking about 2 to 3 months ago and happy about it.  Overall she leads a sedentary lifestyle.  At the time of my evaluation, the patient is alert awake oriented and in no distress. ? ?Past Medical History:  ?Diagnosis Date  ? Abdominal aortic atherosclerosis ([place])   ? Abnormal uterine bleeding 09/26/2020  ? Anxiety   ? Atherosclerosis of renal artery ([place]) 10/13/2013  ? Bipolar depression ([place]) 10/16/2016  ? CAD (coronary artery disease)   ? a. MI/RCA stent 04/2010 in [place].  ? Chronic back pain   ? Associated with occasional leg numbness  ? Cigarette smoker 03/23/2016  ? Dyshidrotic eczema   ? History of kidney stones   ? History of myocardial infarction 09/26/2020  ? History of stent insertion of renal artery 03/23/2016  ? HTN (hypertension) 12/29/2010  ? Hyperlipidemia 12/29/2010  ? Hypertensive disorder 09/26/2020  ? Hypertensive emergency 11/08/2020  ? Insomnia 10/16/2016  ? Leg numbness   ? Leukocytosis 11/08/2020  ? Lymphadenopathy, inguinal   ? RLE lymphadenopathy on PV dopplers 12/2010  ? Myocardial infarct [facility]) 2011  ? Stent to RCA  ? Pelvic pain 10/16/2016  ? Peripheral vascular disease ([place])   ? Renal artery stenosis ([place]) 12/29/2010  ? Renal artery stenosis of unknown cause ([place])   ? Reported bilateral renal artery stents in 2012, not seen on angiogram 12/2010  ? Tobacco use disorder   ? ? ?Past Surgical History:  ?  Procedure Laterality Date  ? CORONARY STENT PLACEMENT  2011  ? KIDNEY STENT PLACEMENT Bilateral 07/2010  ? Renal artery stents  ? ? ?Current Medications: ?Current Meds  ?Medication Sig  ? amLODipine-valsartan (EXFORGE) 5-160 MG tablet Take 1 tablet by mouth daily.  ? escitalopram (LEXAPRO) 10 MG tablet Take 10 mg by mouth daily.  ? gabapentin (NEURONTIN) 600 MG tablet Take 600 mg by mouth 3 (three) times daily.  ? hydrALAZINE (APRESOLINE) 25 MG tablet Take 3 tablets (75 mg total) by mouth 3 (three) times daily.  ? hydrochlorothiazide (HYDRODIURIL) 25 MG tablet Take 25  mg by mouth daily.  ? meloxicam (MOBIC) 15 MG tablet Take 15 mg by mouth daily.  ? metoprolol succinate (TOPROL-XL) 25 MG 24 hr tablet Take 1 tablet (25 mg total) by mouth daily.  ? nitroGLYCERIN (NITROSTAT) 0.4 MG SL tablet Place 0.4 mg under the tongue every 5 (five) minutes as needed for chest pain.  ? ondansetron (ZOFRAN) 4 MG tablet Take 1 tablet (4 mg total) by mouth every 6 (six) hours as needed for nausea.  ? rosuvastatin (CRESTOR) 20 MG tablet Take 20 mg by mouth at bedtime.  ?  ? ?Allergies:   Latex  ? ?Social History  ? ?Socioeconomic History  ? Marital status: Single  ?  Spouse name: Not on file  ? Number of children: Not on file  ? Years of education: Not on file  ? Highest education level: Not on file  ?Occupational History  ? Not on file  ?Tobacco Use  ? Smoking status: Every Day  ?  Years: 15.00  ?  Types: Cigarettes  ?  Last attempt to quit: 07/16/2016  ?  Years since quitting: 5.1  ? Smokeless tobacco: Never  ?Substance and Sexual Activity  ? Alcohol use: Yes  ?  Alcohol/week: 7.0 - 14.0 standard drinks  ?  Types: 7 - 14 Cans of beer per week  ?  Comment: 1-2 beers once-twice week  ? Drug use: Yes  ?  Types: Marijuana  ?  Comment: Daily marijuana  ? Sexual activity: Yes  ?Other Topics Concern  ? Not on file  ?Social History Narrative  ? Not on file  ? ?Social Determinants of Health  ? ?Financial Resource Strain: Not on file  ?Food Insecurity: Not on file  ?Transportation Needs: Not on file  ?Physical Activity: Not on file  ?Stress: Not on file  ?Social Connections: Not on file  ?  ? ?Family History: ?The patient's family history includes Breast cancer in her maternal aunt and maternal grandmother; CAD in her paternal grandmother; Hyperlipidemia in her paternal grandmother; Hypertension in her brother, father, mother, and paternal grandmother; Kidney disease in her paternal grandmother; Prostate cancer in her father; Stroke in her paternal grandmother. ? ?ROS:   ?Please see the history of present  illness.    ?All other systems reviewed and are negative. ? ?EKGs/Labs/Other Studies Reviewed:   ? ?The following studies were reviewed today: ?I discussed my findings with the patient at length ? ? ?Recent Labs: ?11/08/2020: BUN 12; Creatinine, Ser 0.91; Hemoglobin 16.0; Magnesium 2.2; Platelets 252; Potassium 3.9; Sodium 138  ?Recent Lipid Panel ?   ?Component Value Date/Time  ? CHOL 158 10/16/2016 0921  ? TRIG 135 10/16/2016 0921  ? HDL 44 10/16/2016 0921  ? CHOLHDL 3.6 10/16/2016 0921  ? [place] 87 10/16/2016 0921  ? ? ?Physical Exam:   ? ?VS:  BP (!) 152/96   Pulse 74   Ht 5'  6.6" (1.692 m)   Wt 142 lb 6.4 oz (64.6 kg)   SpO2 96%   BMI 22.57 kg/m?    ? ?Wt Readings from Last 3 Encounters:  ?09/01/21 142 lb 6.4 oz (64.6 kg)  ?11/18/20 137 lb 3.2 oz (62.2 kg)  ?11/08/20 140 lb (63.5 kg)  ?  ? ?GEN: Patient is in no acute distress ?HEENT: Normal ?NECK: No JVD; No carotid bruits ?LYMPHATICS: No lymphadenopathy ?CARDIAC: Hear sounds regular, 2/6 systolic murmur at the apex. ?RESPIRATORY:  Clear to auscultation without rales, wheezing or rhonchi  ?ABDOMEN: Soft, non-tender, non-distended ?MUSCULOSKELETAL:  No edema; No deformity  ?SKIN: Warm and dry ?NEUROLOGIC:  Alert and oriented x 3 ?PSYCHIATRIC:  Normal affect  ? ?Signed, ?Jenean Lindau, MD  ?09/01/2021 12:48 PM    ?[place]  ?

## 2021-09-01 NOTE — Patient Instructions (Signed)
Medication Instructions:  ?Your physician recommends that you continue on your current medications as directed. Please refer to the Current Medication list given to you today.  ?*If you need a refill on your cardiac medications before your next appointment, please call your pharmacy* ? ? ?Lab Work: ?None ?If you have labs (blood work) drawn today and your tests are completely normal, you will receive your results only by: ?MyChart Message (if you have MyChart) OR ?A paper copy in the mail ?If you have any lab test that is abnormal or we need to change your treatment, we will call you to review the results. ? ? ?Testing/Procedures: ?Renal Artery Doppler at [facility] ? ? ?Follow-Up: ?At [facility], you and your health needs are our priority.  As part of our continuing mission to provide you with exceptional heart care, we have created designated Provider Care Teams.  These Care Teams include your primary Cardiologist (physician) and Advanced Practice Providers (APPs -  Physician Assistants and Nurse Practitioners) who all work together to provide you with the care you need, when you need it. ? ?We recommend signing up for the patient portal called "MyChart".  Sign up information is provided on this After Visit Summary.  MyChart is used to connect with patients for Virtual Visits (Telemedicine).  Patients are able to view lab/test results, encounter notes, upcoming appointments, etc.  Non-urgent messages can be sent to your provider as well.   ?To learn more about what you can do with MyChart, go to [email or web address].   ? ?Your next appointment:   ?9 month(s) ? ?The format for your next appointment:   ?In Person ? ?Provider:   ?Jyl Heinz, MD  ? ? ?Other Instructions ?None  ?

## 2021-09-12 ENCOUNTER — Telehealth: Payer: Self-pay | Admitting: Cardiology

## 2021-09-12 NOTE — Telephone Encounter (Signed)
Pt is scheduled for 09/19/21 arrive at 0730 for 8:00 [place]. Pt is aware and had no additional questions. ?

## 2021-09-12 NOTE — Telephone Encounter (Signed)
Transferred call to nurse

## 2021-09-12 NOTE — Telephone Encounter (Signed)
Called but no answer or VM.

## 2021-09-12 NOTE — Telephone Encounter (Signed)
Patient states she had an ultrasound scheduled today at [facility] for a blockage in her kidneys, but somehow it was cancelled. Please advise ?

## 2021-09-19 ENCOUNTER — Encounter: Payer: Self-pay | Admitting: Cardiology

## 2021-09-26 ENCOUNTER — Telehealth: Payer: Self-pay | Admitting: Cardiology

## 2021-09-26 NOTE — Telephone Encounter (Signed)
Patient is returning call. Transferred to RN.  

## 2021-09-26 NOTE — Telephone Encounter (Signed)
[place] has been printed and placed for review by Dr. Geraldo Pitter. ? ?Attempted to call pt and advise. No VM ?

## 2021-09-26 NOTE — Telephone Encounter (Signed)
Advised that once Dr. Geraldo Pitter reviews the results we will call her with the results. ?

## 2021-09-26 NOTE — Telephone Encounter (Signed)
Patient is calling for U/S results she had done at [facility].  ?

## 2021-09-29 ENCOUNTER — Other Ambulatory Visit: Payer: Self-pay

## 2021-09-29 DIAGNOSIS — I701 Atherosclerosis of renal artery: Secondary | ICD-10-CM

## 2021-10-09 ENCOUNTER — Encounter: Payer: Self-pay | Admitting: Cardiology

## 2021-10-12 ENCOUNTER — Telehealth: Payer: Self-pay

## 2021-10-12 NOTE — Telephone Encounter (Signed)
Pt aware that the CT angio abd was normal done at [facility]. ?
